# Patient Record
Sex: Female | Born: 1970 | Race: White | Hispanic: No | Marital: Married | Smoking: Never smoker
Health system: Southern US, Community
[De-identification: ages and names within clinical notes are randomized; demographics above are authoritative.]

## PROBLEM LIST (undated history)

## (undated) DIAGNOSIS — M545 Low back pain, unspecified: Secondary | ICD-10-CM

## (undated) DIAGNOSIS — F419 Anxiety disorder, unspecified: Secondary | ICD-10-CM

## (undated) DIAGNOSIS — J302 Other seasonal allergic rhinitis: Secondary | ICD-10-CM

## (undated) DIAGNOSIS — R87619 Unspecified abnormal cytological findings in specimens from cervix uteri: Secondary | ICD-10-CM

## (undated) DIAGNOSIS — G4700 Insomnia, unspecified: Secondary | ICD-10-CM

## (undated) DIAGNOSIS — I10 Essential (primary) hypertension: Secondary | ICD-10-CM

## (undated) DIAGNOSIS — N289 Disorder of kidney and ureter, unspecified: Secondary | ICD-10-CM

## (undated) DIAGNOSIS — G8929 Other chronic pain: Secondary | ICD-10-CM

## (undated) DIAGNOSIS — N809 Endometriosis, unspecified: Secondary | ICD-10-CM

## (undated) HISTORY — DX: Insomnia, unspecified: G47.00

## (undated) HISTORY — PX: DILATION AND CURETTAGE OF UTERUS: SHX78

## (undated) HISTORY — PX: CHOLECYSTECTOMY: SHX55

## (undated) HISTORY — DX: Low back pain, unspecified: M54.50

## (undated) HISTORY — PX: ABDOMINAL HYSTERECTOMY: SHX81

## (undated) HISTORY — PX: DIAGNOSTIC LAPAROSCOPY: SUR761

## (undated) HISTORY — DX: Other chronic pain: G89.29

## (undated) HISTORY — DX: Unspecified abnormal cytological findings in specimens from cervix uteri: R87.619

## (undated) HISTORY — DX: Endometriosis, unspecified: N80.9

---
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
## 2004-11-12 ENCOUNTER — Emergency Department: Payer: Self-pay | Admitting: Unknown Physician Specialty

## 2004-11-22 ENCOUNTER — Ambulatory Visit: Payer: Self-pay | Admitting: Internal Medicine

## 2008-05-09 ENCOUNTER — Ambulatory Visit: Payer: Self-pay | Admitting: General Practice

## 2009-01-06 ENCOUNTER — Ambulatory Visit: Payer: Self-pay | Admitting: General Practice

## 2010-02-08 ENCOUNTER — Ambulatory Visit: Payer: Self-pay | Admitting: Internal Medicine

## 2010-03-18 ENCOUNTER — Ambulatory Visit: Payer: Self-pay | Admitting: General Practice

## 2010-03-19 ENCOUNTER — Ambulatory Visit: Payer: Self-pay | Admitting: General Practice

## 2010-06-09 ENCOUNTER — Ambulatory Visit: Payer: Self-pay | Admitting: General Practice

## 2011-01-26 ENCOUNTER — Ambulatory Visit: Payer: Self-pay | Admitting: General Practice

## 2011-02-15 ENCOUNTER — Ambulatory Visit: Payer: Self-pay | Admitting: General Practice

## 2011-06-01 ENCOUNTER — Ambulatory Visit: Payer: Self-pay | Admitting: Surgery

## 2011-06-02 LAB — PATHOLOGY REPORT

## 2011-10-18 ENCOUNTER — Emergency Department: Payer: Self-pay | Admitting: Emergency Medicine

## 2011-10-24 ENCOUNTER — Ambulatory Visit: Payer: Self-pay | Admitting: General Practice

## 2011-11-01 ENCOUNTER — Ambulatory Visit: Payer: Self-pay | Admitting: General Practice

## 2012-03-15 ENCOUNTER — Ambulatory Visit: Payer: Self-pay | Admitting: Urology

## 2012-03-22 ENCOUNTER — Ambulatory Visit: Payer: Self-pay | Admitting: Urology

## 2013-09-04 ENCOUNTER — Ambulatory Visit: Payer: Self-pay | Admitting: Obstetrics and Gynecology

## 2013-09-04 LAB — CBC
HCT: 40.4 % (ref 35.0–47.0)
MCH: 29.7 pg (ref 26.0–34.0)
RBC: 4.64 10*6/uL (ref 3.80–5.20)
RDW: 13.7 % (ref 11.5–14.5)
WBC: 8 10*3/uL (ref 3.6–11.0)

## 2013-09-04 LAB — BASIC METABOLIC PANEL
Chloride: 104 mmol/L (ref 98–107)
Creatinine: 0.78 mg/dL (ref 0.60–1.30)
Osmolality: 277 (ref 275–301)
Potassium: 4 mmol/L (ref 3.5–5.1)

## 2013-09-17 ENCOUNTER — Ambulatory Visit: Payer: Self-pay | Admitting: General Practice

## 2013-09-18 HISTORY — PX: ABLATION: SHX5711

## 2013-09-20 ENCOUNTER — Ambulatory Visit: Payer: Self-pay | Admitting: Obstetrics and Gynecology

## 2013-09-20 LAB — BASIC METABOLIC PANEL
BUN: 13 mg/dL (ref 7–18)
Calcium, Total: 8.4 mg/dL — ABNORMAL LOW (ref 8.5–10.1)
Calcium, Total: 8.4 mg/dL — ABNORMAL LOW (ref 8.5–10.1)
Chloride: 104 mmol/L (ref 98–107)
Creatinine: 1.21 mg/dL (ref 0.60–1.30)
Creatinine: 1.25 mg/dL (ref 0.60–1.30)
EGFR (African American): >60
EGFR (African American): >60
EGFR (Non-African Amer.): 56 — ABNORMAL LOW
EGFR (Non-African Amer.): 54 — ABNORMAL LOW
Glucose: 156 mg/dL — ABNORMAL HIGH (ref 65–99)
Osmolality: 275 (ref 275–301)

## 2013-09-20 LAB — URINALYSIS, COMPLETE
Bacteria: NONE SEEN
Glucose,UR: NEGATIVE mg/dL (ref 0–75)
Leukocyte Esterase: NEGATIVE
Ph: 5 (ref 4.5–8.0)
Protein: NEGATIVE
Specific Gravity: 1.026 (ref 1.003–1.030)
Squamous Epithelial: NONE SEEN

## 2013-09-20 LAB — CBC WITH DIFFERENTIAL/PLATELET
Basophil %: 0.6 %
Eosinophil #: 0 10*3/uL (ref 0.0–0.7)
Eosinophil %: 0 %
HCT: 37.8 % (ref 35.0–47.0)
HGB: 12.9 g/dL (ref 12.0–16.0)
Lymphocyte #: 1 10*3/uL (ref 1.0–3.6)
Monocyte #: 0.3 x10 3/mm (ref 0.2–0.9)
Neutrophil %: 92.7 %
Platelet: 195 10*3/uL (ref 150–440)
RBC: 4.37 10*6/uL (ref 3.80–5.20)
RDW: 13.5 % (ref 11.5–14.5)
WBC: 18.5 10*3/uL — ABNORMAL HIGH (ref 3.6–11.0)

## 2013-09-21 LAB — CBC WITH DIFFERENTIAL/PLATELET
Basophil #: 0 10*3/uL (ref 0.0–0.1)
Basophil %: 0.1 %
Eosinophil #: 0 10*3/uL (ref 0.0–0.7)
Eosinophil %: 0 %
HGB: 12.2 g/dL (ref 12.0–16.0)
Lymphocyte #: 0.8 10*3/uL — ABNORMAL LOW (ref 1.0–3.6)
Lymphocyte %: 5.6 %
MCH: 29.3 pg (ref 26.0–34.0)
MCHC: 33.6 g/dL (ref 32.0–36.0)
MCV: 87 fL (ref 80–100)
Monocyte #: 0.6 x10 3/mm (ref 0.2–0.9)
Monocyte %: 3.9 %
Neutrophil #: 13 10*3/uL — ABNORMAL HIGH (ref 1.4–6.5)
Platelet: 209 10*3/uL (ref 150–440)

## 2013-09-21 LAB — BASIC METABOLIC PANEL
BUN: 10 mg/dL (ref 7–18)
Calcium, Total: 8 mg/dL — ABNORMAL LOW (ref 8.5–10.1)
Calcium, Total: 7.9 mg/dL — ABNORMAL LOW (ref 8.5–10.1)
Chloride: 103 mmol/L (ref 98–107)
Co2: 25 mmol/L (ref 21–32)
EGFR (African American): >60
EGFR (African American): >60
Glucose: 134 mg/dL — ABNORMAL HIGH (ref 65–99)
Osmolality: 273 (ref 275–301)
Potassium: 4 mmol/L (ref 3.5–5.1)
Potassium: 3.8 mmol/L (ref 3.5–5.1)
Sodium: 136 mmol/L (ref 136–145)
Sodium: 135 mmol/L — ABNORMAL LOW (ref 136–145)

## 2013-09-25 LAB — PATHOLOGY REPORT

## 2014-12-01 ENCOUNTER — Emergency Department: Payer: Self-pay | Admitting: Emergency Medicine

## 2014-12-01 LAB — COMPREHENSIVE METABOLIC PANEL
ALBUMIN: 3.7 g/dL (ref 3.4–5.0)
ALK PHOS: 68 U/L
Anion Gap: 11 (ref 7–16)
BUN: 14 mg/dL (ref 7–18)
Bilirubin,Total: 0.4 mg/dL (ref 0.2–1.0)
CREATININE: 1.04 mg/dL (ref 0.60–1.30)
Calcium, Total: 9 mg/dL (ref 8.5–10.1)
Chloride: 105 mmol/L (ref 98–107)
Co2: 24 mmol/L (ref 21–32)
Glucose: 114 mg/dL — ABNORMAL HIGH (ref 65–99)
Osmolality: 281 (ref 275–301)
Potassium: 3.5 mmol/L (ref 3.5–5.1)
SGOT(AST): 19 U/L (ref 15–37)
SGPT (ALT): 23 U/L
Sodium: 140 mmol/L (ref 136–145)
Total Protein: 7.5 g/dL (ref 6.4–8.2)

## 2014-12-01 LAB — CBC WITH DIFFERENTIAL/PLATELET
Basophil #: 0.1 10*3/uL (ref 0.0–0.1)
Basophil %: 1 %
EOS PCT: 0.5 %
Eosinophil #: 0.1 10*3/uL (ref 0.0–0.7)
HCT: 41.1 % (ref 35.0–47.0)
HGB: 13.5 g/dL (ref 12.0–16.0)
LYMPHS ABS: 2.3 10*3/uL (ref 1.0–3.6)
LYMPHS PCT: 15.9 %
MCH: 29.1 pg (ref 26.0–34.0)
MCHC: 32.9 g/dL (ref 32.0–36.0)
MCV: 88 fL (ref 80–100)
Monocyte #: 0.8 x10 3/mm (ref 0.2–0.9)
Monocyte %: 5.2 %
NEUTROS PCT: 77.4 %
Neutrophil #: 11.3 10*3/uL — ABNORMAL HIGH (ref 1.4–6.5)
PLATELETS: 248 10*3/uL (ref 150–440)
RBC: 4.65 10*6/uL (ref 3.80–5.20)
RDW: 13.4 % (ref 11.5–14.5)
WBC: 14.6 10*3/uL — AB (ref 3.6–11.0)

## 2014-12-01 LAB — URINALYSIS, COMPLETE
Bilirubin,UR: NEGATIVE
Glucose,UR: NEGATIVE mg/dL (ref 0–75)
LEUKOCYTE ESTERASE: NEGATIVE
NITRITE: NEGATIVE
Ph: 5 (ref 4.5–8.0)
Specific Gravity: 1.03 (ref 1.003–1.030)
Squamous Epithelial: 5

## 2014-12-01 LAB — HCG, QUANTITATIVE, PREGNANCY

## 2015-01-22 ENCOUNTER — Emergency Department: Payer: Self-pay | Admitting: Emergency Medicine

## 2015-01-22 LAB — CBC WITH DIFFERENTIAL/PLATELET
BASOS PCT: 0.5 %
Basophil #: 0.1 10*3/uL (ref 0.0–0.1)
EOS ABS: 0.1 10*3/uL (ref 0.0–0.7)
EOS PCT: 1.2 %
HCT: 41.4 % (ref 35.0–47.0)
HGB: 13.8 g/dL (ref 12.0–16.0)
Lymphocyte #: 2.8 10*3/uL (ref 1.0–3.6)
Lymphocyte %: 23.9 %
MCH: 29.2 pg (ref 26.0–34.0)
MCHC: 33.2 g/dL (ref 32.0–36.0)
MCV: 88 fL (ref 80–100)
MONO ABS: 0.7 x10 3/mm (ref 0.2–0.9)
Monocyte %: 5.8 %
NEUTROS ABS: 7.9 10*3/uL — AB (ref 1.4–6.5)
Neutrophil %: 68.6 %
Platelet: 263 10*3/uL (ref 150–440)
RBC: 4.72 10*6/uL (ref 3.80–5.20)
RDW: 13.5 % (ref 11.5–14.5)
WBC: 11.6 10*3/uL — ABNORMAL HIGH (ref 3.6–11.0)

## 2015-01-22 LAB — COMPREHENSIVE METABOLIC PANEL
ALBUMIN: 3.4 g/dL (ref 3.4–5.0)
ALK PHOS: 63 U/L (ref 46–116)
ANION GAP: 9 (ref 7–16)
BUN: 16 mg/dL (ref 7–18)
Bilirubin,Total: 0.4 mg/dL (ref 0.2–1.0)
Calcium, Total: 8.8 mg/dL (ref 8.5–10.1)
Chloride: 106 mmol/L (ref 98–107)
Co2: 25 mmol/L (ref 21–32)
Creatinine: 1.09 mg/dL (ref 0.60–1.30)
EGFR (African American): >60
EGFR (Non-African Amer.): 59 — ABNORMAL LOW
GLUCOSE: 108 mg/dL — AB (ref 65–99)
Osmolality: 281 (ref 275–301)
Potassium: 3.6 mmol/L (ref 3.5–5.1)
SGOT(AST): 22 U/L (ref 15–37)
SGPT (ALT): 23 U/L (ref 14–63)
Sodium: 140 mmol/L (ref 136–145)
Total Protein: 7.7 g/dL (ref 6.4–8.2)

## 2015-01-22 LAB — LIPASE, BLOOD: Lipase: 70 U/L — ABNORMAL LOW (ref 73–393)

## 2015-01-22 LAB — MAGNESIUM: MAGNESIUM: 2 mg/dL

## 2015-01-22 LAB — HCG, QUANTITATIVE, PREGNANCY: Beta Hcg, Quant.: <1 m[IU]/mL — ABNORMAL LOW

## 2015-02-10 DIAGNOSIS — G8929 Other chronic pain: Secondary | ICD-10-CM | POA: Diagnosis present

## 2015-02-10 DIAGNOSIS — D759 Disease of blood and blood-forming organs, unspecified: Secondary | ICD-10-CM | POA: Insufficient documentation

## 2015-02-10 DIAGNOSIS — R102 Pelvic and perineal pain: Secondary | ICD-10-CM | POA: Diagnosis present

## 2015-02-10 DIAGNOSIS — Z9889 Other specified postprocedural states: Secondary | ICD-10-CM | POA: Diagnosis present

## 2015-04-10 NOTE — Op Note (Signed)
PATIENT NAME:  Felicia Barker, Felicia Barker MR#:  [id number] DATE OF BIRTH:  27-Mar-1971  DATE OF PROCEDURE:  09/20/2013  PREOPERATIVE DIAGNOSES:  Left ovarian cyst and menorrhagia.   POSTOPERATIVE DIAGNOSES:  Left endometrioma, stage II endometriosis.   OPERATION PERFORMED:  Hysteroscopy, dilatation and curettage, NovaSure endometrial ablation, and left oophorectomy.   ANESTHESIA:  General.  PRIMARY SERVICE:  Malachy Mood, MD  PREOPERATIVE ANTIBIOTICS:  None.   DRAINS OR TUBES:  None.  IMPLANTS:  None.   ESTIMATED BLOOD LOSS:  25 mL.  OPERATIVE FLUIDS: 1300 mL  crystalloid.  URINE OUTPUT:  150 mL of clear urine.   COMPLICATIONS:  None.   INTRAOPERATIVE FINDINGS: Left endometrioma adhesed to the left pelvic sidewall, the cul-de-sac with some filmy adhesions, also some involvement of the right tube.  The overall stage of endometriosis is stage II. The cervix was noted to be somewhat stenotic. The uterus had minimal descensus. The cavity appeared normal in contour, with a moderate amount of tissue removed on D and C. The cavity sounding length was 10 cm, the cervical length was 5.5 cm, for a cavity length of 4.5 cm. Cavity width was found to be 3.0 cm.   SPECIMENS REMOVED:  Endometrial curetting.   PATIENT CONDITION FOLLOWING THE PROCEDURE:  Stable.   PROCEDURE IN DETAIL: Risks, benefits and alternatives of the procedure were discussed with the patient prior to proceeding to the operating room. The patient was taken to the operating room, where she was placed under general endotracheal anesthesia. The patient was positioned in the dorsal lithotomy position using Allen stirrups, prepped and draped in the usual sterile fashion before performing a timeout procedure. Attention was turned to the patient's pelvis. A red rubber catheter was used to empty the patient's bladder. An operative speculum was then placed. The anterior lip of the cervix was visualized, grasped with a single-tooth tenaculum, and a  Hulka tenaculum was placed. The single-tooth tenaculum and speculum were then removed.   Attention was turned to the patient's abdomen. The umbilicus was infiltrated with 1% lidocaine. A stab incision was made at the base of the umbilicus, and a 5 mm Xcel trocar was used to gain entry into the abdominal cavity under direct visualization. Upon entering the peritoneal cavity, insufflation was begun. A 12 mm left lateral assistance port was placed under direct visualization, followed by a 5 mm right lateral assistance port.  Inspection of the pelvis revealed the above findings. The uterus was elevated. The ovary was grasped and dissected off of the adhesions to the posterior uterus and left pelvic sidewall using blunt dissection. The IP ligament was then identified, ligated using the 5 mm LigaSure hugging the ovary. The ovary ovary was then transected off the mesosalpinx and the utero-ovarian ligament using the 55mm harmonic. There was some bleeding from the IP ligament, which was cauterized using a short application bipolar diathermy. Following this, the pedicle was inspected and noted to be hemostatic. The pelvis was irrigated. The 12 mm port site was used to extract the ovary using a reusable Endo Catch bag. Following removal of the specimen, the 12 mm port site was closed using a laparoscopic closure device. There was no defect noted in the fascia after tying down the 0 Vicryl for the fascia. The skin of the 12 mm port site was closed using a 4-0 Monocryl in a subcuticular fashion. The remaining port sites were then dressed with Dermabond.   Attention was then turned to the patient's pelvis. The Hulka tenaculum was  removed. The operative speculum was replaced, as was the single-tooth tenaculum. The cervix was sequentially dilated to allow hysteroscopy. Hysteroscopy revealed some scarring of the cervix and difficult entry into the uterine cavity. However, once the internal os was sufficiently dilated to allow  passage of the hysteroscope, there were no defects or abnormal contours noted to the cavity. There was, however, a moderate amount of fluffy endometrium noted. Sharp curettage was performed. Following the sharp curettage, the uterus length was re-measured at 10 cm. The cervical length was measured using the hysteroscope at 5.5 cm, yielding a cavity length of 4.5 cm. The NovaSure device was then placed. The cavity width was 3 cm. The device was activated and passed cavity assessment. The device was then activated a second time for the actual ablation. The ablation time was 2 minutes, at which time the device shut off. Following the ablation, the NovaSure device was removed, and a followup hysteroscopy revealed good coverage of the endometrial cavity by the NovaSure ablation, with sparing of the endocervical canal. The single-tooth tenaculum was removed. The tenaculum sites were hemostatic. Sponge, needle and instrument counts were correct x2. The patient tolerated the procedure well, and was taken to the recovery room in stable condition.     ____________________________ Stoney Bang. Georgianne Fick, MD ams:mr D: 09/20/2013 17:43:00 ET T: 09/20/2013 21:36:46 ET JOB#: [id number]  cc: Stoney Bang. Georgianne Fick, MD, <Dictator> Dorthula Nettles MD ELECTRONICALLY SIGNED 09/21/2013 14:07

## 2015-05-04 ENCOUNTER — Other Ambulatory Visit: Payer: Self-pay

## 2015-05-07 ENCOUNTER — Encounter
Admission: RE | Admit: 2015-05-07 | Discharge: 2015-05-07 | Disposition: A | Discharge status: Home / Self Care | Payer: BLUE CROSS/BLUE SHIELD | Source: Ambulatory Visit | Attending: Obstetrics and Gynecology | Admitting: Obstetrics and Gynecology

## 2015-05-07 DIAGNOSIS — Z01812 Encounter for preprocedural laboratory examination: Secondary | ICD-10-CM | POA: Diagnosis present

## 2015-05-07 DIAGNOSIS — R102 Pelvic and perineal pain: Secondary | ICD-10-CM | POA: Diagnosis not present

## 2015-05-07 DIAGNOSIS — I10 Essential (primary) hypertension: Secondary | ICD-10-CM | POA: Diagnosis not present

## 2015-05-07 DIAGNOSIS — N946 Dysmenorrhea, unspecified: Secondary | ICD-10-CM | POA: Insufficient documentation

## 2015-05-07 DIAGNOSIS — Z01810 Encounter for preprocedural cardiovascular examination: Secondary | ICD-10-CM | POA: Diagnosis not present

## 2015-05-07 HISTORY — DX: Anxiety disorder, unspecified: F41.9

## 2015-05-07 HISTORY — DX: Essential (primary) hypertension: I10

## 2015-05-07 HISTORY — DX: Disorder of kidney and ureter, unspecified: N28.9

## 2015-05-07 HISTORY — DX: Other seasonal allergic rhinitis: J30.2

## 2015-05-07 LAB — CBC
HCT: 40.7 % (ref 35.0–47.0)
HEMOGLOBIN: 13.6 g/dL (ref 12.0–16.0)
MCH: 29.3 pg (ref 26.0–34.0)
MCHC: 33.6 g/dL (ref 32.0–36.0)
MCV: 87.3 fL (ref 80.0–100.0)
PLATELETS: 241 10*3/uL (ref 150–440)
RBC: 4.66 MIL/uL (ref 3.80–5.20)
RDW: 13.4 % (ref 11.5–14.5)
WBC: 9.4 10*3/uL (ref 3.6–11.0)

## 2015-05-07 LAB — BASIC METABOLIC PANEL
ANION GAP: 8 (ref 5–15)
BUN: 17 mg/dL (ref 6–20)
CHLORIDE: 106 mmol/L (ref 101–111)
CO2: 26 mmol/L (ref 22–32)
Calcium: 8.9 mg/dL (ref 8.9–10.3)
Creatinine, Ser: 0.87 mg/dL (ref 0.44–1.00)
GFR calc non Af Amer: >60 mL/min (ref >60)
Glucose, Bld: 91 mg/dL (ref 65–99)
Potassium: 3.8 mmol/L (ref 3.5–5.1)
SODIUM: 140 mmol/L (ref 135–145)

## 2015-05-07 LAB — TYPE AND SCREEN
ABO/RH(D): O POS
Antibody Screen: NEGATIVE

## 2015-05-07 LAB — ABO/RH: ABO/RH(D): O POS

## 2015-05-07 NOTE — Patient Instructions (Addendum)
  Your procedure is scheduled on: 05/12/2015  Report to Day Surgery  To find out your arrival time please call [phone] between 1PM - 3PM on 05/11/2015  Remember: Instructions that are not followed completely may result in serious medical risk, up to and including death, or upon the discretion of your surgeon and anesthesiologist your surgery may need to be rescheduled.    ___x_ 1. Do not eat food or drink liquids after midnight. No gum chewing or hard candies.     ___x_ 2. No Alcohol for 24 hours before or after surgery.   ___x_ 3. Bring all medications with you on the day of surgery if instructed. B   ___x_ 4. Notify your doctor if there is any change in your medical condition     (cold, fever, infections).     Do not wear jewelry, make-up, hairpins, clips or nail polish.  Do not wear lotions, powders, or perfumes. You may wear deodorant.  Do not shave 48 hours prior to surgery. Men may shave face and neck.  Do not bring valuables to the hospital.    [facility] is not responsible for any belongings or valuables.               Contacts, dentures or bridgework may not be worn into surgery.  Leave your suitcase in the car. After surgery it may be brought to your room.  For patients admitted to the hospital, discharge time is determined by your                treatment team.   Patients discharged the day of surgery will not be allowed to drive home.   Please read over the following fact sheets that you were given:   Surgical Site Infection Prevention   ____ Take these medicines the morning of surgery with A SIP OF WATER:    1. May take Zyrtec   2. Bring bisoprolol/hctz  3.   4.  5.  6.  ____ Fleet Enema (as directed)   __x__ Use CHG Soap as directed  ____ Use inhalers on the day of surgery  ____ Stop metformin 2 days prior to surgery    ____ Take 1/2 of usual insulin dose the night before surgery and none on the morning of surgery.   ____ Stop  Coumadin/Plavix/aspirin on    __x__ Stop Anti-inflammatories on  Today 5/19   ____ Stop supplements until after surgery.    ____ Bring C-Pap to the hospital.

## 2015-05-12 ENCOUNTER — Observation Stay
Admission: AD | Admit: 2015-05-12 | Discharge: 2015-05-13 | Disposition: A | Discharge status: Home / Self Care | Payer: BLUE CROSS/BLUE SHIELD | Source: Ambulatory Visit | Attending: Obstetrics and Gynecology | Admitting: Obstetrics and Gynecology

## 2015-05-12 ENCOUNTER — Ambulatory Visit: Payer: BLUE CROSS/BLUE SHIELD | Admitting: Anesthesiology

## 2015-05-12 ENCOUNTER — Encounter
Admission: AD | Disposition: A | Discharge status: Home / Self Care | Payer: Self-pay | Source: Ambulatory Visit | Attending: Obstetrics and Gynecology

## 2015-05-12 DIAGNOSIS — N8320 Unspecified ovarian cysts: Secondary | ICD-10-CM | POA: Insufficient documentation

## 2015-05-12 DIAGNOSIS — Z79899 Other long term (current) drug therapy: Secondary | ICD-10-CM | POA: Insufficient documentation

## 2015-05-12 DIAGNOSIS — I10 Essential (primary) hypertension: Secondary | ICD-10-CM | POA: Diagnosis not present

## 2015-05-12 DIAGNOSIS — Z881 Allergy status to other antibiotic agents status: Secondary | ICD-10-CM | POA: Insufficient documentation

## 2015-05-12 DIAGNOSIS — N946 Dysmenorrhea, unspecified: Secondary | ICD-10-CM | POA: Insufficient documentation

## 2015-05-12 DIAGNOSIS — Z9049 Acquired absence of other specified parts of digestive tract: Secondary | ICD-10-CM | POA: Insufficient documentation

## 2015-05-12 DIAGNOSIS — G8929 Other chronic pain: Secondary | ICD-10-CM | POA: Diagnosis not present

## 2015-05-12 DIAGNOSIS — N736 Female pelvic peritoneal adhesions (postinfective): Secondary | ICD-10-CM | POA: Insufficient documentation

## 2015-05-12 DIAGNOSIS — Z833 Family history of diabetes mellitus: Secondary | ICD-10-CM | POA: Diagnosis not present

## 2015-05-12 DIAGNOSIS — Z8249 Family history of ischemic heart disease and other diseases of the circulatory system: Secondary | ICD-10-CM | POA: Insufficient documentation

## 2015-05-12 DIAGNOSIS — Z803 Family history of malignant neoplasm of breast: Secondary | ICD-10-CM | POA: Diagnosis not present

## 2015-05-12 DIAGNOSIS — R102 Pelvic and perineal pain unspecified side: Secondary | ICD-10-CM | POA: Diagnosis present

## 2015-05-12 DIAGNOSIS — N800 Endometriosis of uterus: Principal | ICD-10-CM | POA: Insufficient documentation

## 2015-05-12 DIAGNOSIS — D251 Intramural leiomyoma of uterus: Secondary | ICD-10-CM | POA: Insufficient documentation

## 2015-05-12 DIAGNOSIS — Z90721 Acquired absence of ovaries, unilateral: Secondary | ICD-10-CM

## 2015-05-12 DIAGNOSIS — N802 Endometriosis of fallopian tube: Secondary | ICD-10-CM | POA: Diagnosis not present

## 2015-05-12 DIAGNOSIS — Z90710 Acquired absence of both cervix and uterus: Secondary | ICD-10-CM

## 2015-05-12 DIAGNOSIS — Z882 Allergy status to sulfonamides status: Secondary | ICD-10-CM | POA: Diagnosis not present

## 2015-05-12 DIAGNOSIS — Z9889 Other specified postprocedural states: Secondary | ICD-10-CM | POA: Diagnosis present

## 2015-05-12 HISTORY — PX: LAPAROSCOPIC SALPINGO OOPHERECTOMY: SHX5927

## 2015-05-12 HISTORY — PX: LAPAROSCOPIC HYSTERECTOMY: SHX1926

## 2015-05-12 HISTORY — PX: CYSTOSCOPY: SHX5120

## 2015-05-12 LAB — POCT PREGNANCY, URINE: PREG TEST UR: NEGATIVE

## 2015-05-12 SURGERY — HYSTERECTOMY, TOTAL, LAPAROSCOPIC
Anesthesia: General | Laterality: Right

## 2015-05-12 MED ORDER — DIPHENHYDRAMINE HCL 50 MG/ML IJ SOLN: 12.5000 mg | Freq: Four times a day (QID) | INTRAMUSCULAR | Status: DC | PRN

## 2015-05-12 MED ORDER — FENTANYL CITRATE (PF) 100 MCG/2ML IJ SOLN
25.0000 ug | INTRAMUSCULAR | Status: AC | PRN
  Administered 2015-05-12 (×6): 25 ug via INTRAVENOUS

## 2015-05-12 MED ORDER — SUCCINYLCHOLINE CHLORIDE 20 MG/ML IJ SOLN
INTRAMUSCULAR | Status: DC | PRN
  Administered 2015-05-12: 100 mg via INTRAVENOUS

## 2015-05-12 MED ORDER — ONDANSETRON HCL 4 MG PO TABS: 4.0000 mg | ORAL_TABLET | Freq: Four times a day (QID) | ORAL | Status: DC | PRN

## 2015-05-12 MED ORDER — LACTATED RINGERS IV SOLN
INTRAVENOUS | Status: DC
  Administered 2015-05-12 (×2): via INTRAVENOUS

## 2015-05-12 MED ORDER — FAMOTIDINE 20 MG PO TABS
ORAL_TABLET | ORAL | Status: AC
  Filled 2015-05-12: qty 1

## 2015-05-12 MED ORDER — BUPIVACAINE HCL 0.5 % IJ SOLN
INTRAMUSCULAR | Status: DC | PRN
  Administered 2015-05-12: 10 mL
  Administered 2015-05-12: 7 mL

## 2015-05-12 MED ORDER — GLYCOPYRROLATE 0.2 MG/ML IJ SOLN
INTRAMUSCULAR | Status: DC | PRN
  Administered 2015-05-12: 0.6 mg via INTRAVENOUS

## 2015-05-12 MED ORDER — NALOXONE HCL 0.4 MG/ML IJ SOLN: 0.4000 mg | INTRAMUSCULAR | Status: DC | PRN

## 2015-05-12 MED ORDER — FAMOTIDINE 20 MG PO TABS
20.0000 mg | ORAL_TABLET | Freq: Once | ORAL | Status: AC
  Administered 2015-05-12: 20 mg via ORAL

## 2015-05-12 MED ORDER — MORPHINE SULFATE (PF) 1 MG/ML IV SOLN
INTRAVENOUS | Status: DC
  Administered 2015-05-12 (×2): 1 mg via INTRAVENOUS
  Administered 2015-05-12: 9 mL via INTRAVENOUS
  Administered 2015-05-13: 1 mL via INTRAVENOUS
  Filled 2015-05-12 (×3): qty 25

## 2015-05-12 MED ORDER — DIPHENHYDRAMINE HCL 12.5 MG/5ML PO ELIX
12.5000 mg | ORAL_SOLUTION | Freq: Four times a day (QID) | ORAL | Status: DC | PRN
  Filled 2015-05-12: qty 5

## 2015-05-12 MED ORDER — FENTANYL CITRATE (PF) 100 MCG/2ML IJ SOLN
INTRAMUSCULAR | Status: DC | PRN
  Administered 2015-05-12: 50 ug via INTRAVENOUS
  Administered 2015-05-12 (×2): 100 ug via INTRAVENOUS

## 2015-05-12 MED ORDER — ONDANSETRON HCL 4 MG/2ML IJ SOLN: 4.0000 mg | Freq: Once | INTRAMUSCULAR | Status: DC | PRN

## 2015-05-12 MED ORDER — DEXTROSE 5 % IV SOLN
1.0000 g | Freq: Once | INTRAVENOUS | Status: AC
  Administered 2015-05-12: 1 g via INTRAVENOUS
  Filled 2015-05-12: qty 1

## 2015-05-12 MED ORDER — FENTANYL CITRATE (PF) 100 MCG/2ML IJ SOLN
INTRAMUSCULAR | Status: AC
  Filled 2015-05-12: qty 2

## 2015-05-12 MED ORDER — PROPOFOL 10 MG/ML IV BOLUS
INTRAVENOUS | Status: DC | PRN
  Administered 2015-05-12: 150 mg via INTRAVENOUS

## 2015-05-12 MED ORDER — DEXMEDETOMIDINE HCL IN NACL 200 MCG/50ML IV SOLN
INTRAVENOUS | Status: DC | PRN
Start: 2015-05-12 — End: 2015-05-12
  Administered 2015-05-12: 12 ug via INTRAVENOUS

## 2015-05-12 MED ORDER — ROCURONIUM BROMIDE 100 MG/10ML IV SOLN
INTRAVENOUS | Status: DC | PRN
Start: 2015-05-12 — End: 2015-05-12
  Administered 2015-05-12: 25 mg via INTRAVENOUS
  Administered 2015-05-12: 5 mg via INTRAVENOUS
  Administered 2015-05-12: 20 mg via INTRAVENOUS

## 2015-05-12 MED ORDER — LIDOCAINE HCL (CARDIAC) 20 MG/ML IV SOLN
INTRAVENOUS | Status: DC | PRN
Start: 2015-05-12 — End: 2015-05-12
  Administered 2015-05-12: 100 mg via INTRAVENOUS

## 2015-05-12 MED ORDER — LABETALOL HCL 5 MG/ML IV SOLN
INTRAVENOUS | Status: DC | PRN
  Administered 2015-05-12: 10 mg via INTRAVENOUS

## 2015-05-12 MED ORDER — SIMETHICONE 80 MG PO CHEW
80.0000 mg | CHEWABLE_TABLET | Freq: Four times a day (QID) | ORAL | Status: DC | PRN
  Administered 2015-05-13 (×2): 80 mg via ORAL
  Filled 2015-05-12 (×2): qty 1

## 2015-05-12 MED ORDER — BISOPROLOL FUMARATE 5 MG PO TABS
5.0000 mg | ORAL_TABLET | Freq: Once | ORAL | Status: AC
  Administered 2015-05-12: 5 mg via ORAL
  Filled 2015-05-12: qty 1

## 2015-05-12 MED ORDER — BUPIVACAINE HCL (PF) 0.5 % IJ SOLN
INTRAMUSCULAR | Status: AC
  Filled 2015-05-12: qty 30

## 2015-05-12 MED ORDER — DEXTROSE-NACL 5-0.45 % IV SOLN
INTRAVENOUS | Status: DC
  Administered 2015-05-12: 125 mL/h via INTRAVENOUS
  Administered 2015-05-13: 1 mL via INTRAVENOUS
  Administered 2015-05-13: 12:00:00 via INTRAVENOUS

## 2015-05-12 MED ORDER — ONDANSETRON HCL 4 MG/2ML IJ SOLN
4.0000 mg | Freq: Four times a day (QID) | INTRAMUSCULAR | Status: DC | PRN
Start: 2015-05-12 — End: 2015-05-13
  Administered 2015-05-12: 4 mg via INTRAVENOUS
  Filled 2015-05-12: qty 2

## 2015-05-12 MED ORDER — KETOROLAC TROMETHAMINE 30 MG/ML IJ SOLN
30.0000 mg | Freq: Four times a day (QID) | INTRAMUSCULAR | Status: DC
  Administered 2015-05-12 – 2015-05-13 (×2): 30 mg via INTRAVENOUS
  Filled 2015-05-12 (×2): qty 1

## 2015-05-12 MED ORDER — KETOROLAC TROMETHAMINE 30 MG/ML IJ SOLN: 30.0000 mg | Freq: Four times a day (QID) | INTRAMUSCULAR | Status: DC

## 2015-05-12 MED ORDER — MIDAZOLAM HCL 2 MG/2ML IJ SOLN
INTRAMUSCULAR | Status: DC | PRN
  Administered 2015-05-12: 2 mg via INTRAVENOUS

## 2015-05-12 MED ORDER — ONDANSETRON HCL 4 MG/2ML IJ SOLN
INTRAMUSCULAR | Status: DC | PRN
  Administered 2015-05-12: 4 mg via INTRAVENOUS

## 2015-05-12 MED ORDER — LIDOCAINE HCL (PF) 1 % IJ SOLN
INTRAMUSCULAR | Status: AC
  Administered 2015-05-12: 09:00:00 via SUBCUTANEOUS
  Filled 2015-05-12: qty 2

## 2015-05-12 MED ORDER — DEXAMETHASONE SODIUM PHOSPHATE 4 MG/ML IJ SOLN
INTRAMUSCULAR | Status: DC | PRN
  Administered 2015-05-12: 10 mg via INTRAVENOUS

## 2015-05-12 MED ORDER — NEOSTIGMINE METHYLSULFATE 10 MG/10ML IV SOLN
INTRAVENOUS | Status: DC | PRN
Start: 2015-05-12 — End: 2015-05-12
  Administered 2015-05-12: 4 mg via INTRAVENOUS

## 2015-05-12 MED ORDER — SODIUM CHLORIDE 0.9 % IJ SOLN: 9.0000 mL | INTRAMUSCULAR | Status: DC | PRN

## 2015-05-12 SURGICAL SUPPLY — 55 items
APPLICATOR ARISTA FLEXITIP XL (MISCELLANEOUS) ×4 IMPLANT
ARISTA ABSORBABLE HEMOSTATIC PARTICLES ×4 IMPLANT
BAG URO DRAIN 2000ML W/SPOUT (MISCELLANEOUS) ×4 IMPLANT
BLADE SURG SZ11 CARB STEEL (BLADE) ×4 IMPLANT
CANISTER SUCT 1200ML W/VALVE (MISCELLANEOUS) ×4 IMPLANT
CATH FOLEY 2WAY  5CC 16FR (CATHETERS) ×2
CATH ROBINSON RED A/P 16FR (CATHETERS) IMPLANT
CATH URTH 16FR FL 2W BLN LF (CATHETERS) ×2 IMPLANT
CHLORAPREP W/TINT 26ML (MISCELLANEOUS) ×4 IMPLANT
DRAPE UNDER BUTTOCK W/FLU (DRAPES) ×4 IMPLANT
FILTER LAP SMOKE EVAC STRL (MISCELLANEOUS) ×4 IMPLANT
GLOVE BIO SURGEON STRL SZ7 (GLOVE) ×16 IMPLANT
GLOVE INDICATOR 7.5 STRL GRN (GLOVE) ×16 IMPLANT
GOWN STRL REUS W/ TWL LRG LVL3 (GOWN DISPOSABLE) ×4 IMPLANT
GOWN STRL REUS W/ TWL XL LVL3 (GOWN DISPOSABLE) ×2 IMPLANT
GOWN STRL REUS W/TWL LRG LVL3 (GOWN DISPOSABLE) ×4
GOWN STRL REUS W/TWL XL LVL3 (GOWN DISPOSABLE) ×2
GRASPER SUT TROCAR 14GX15 (MISCELLANEOUS) IMPLANT
HEMOSTAT ARISTA ABSORB 3G PWDR (MISCELLANEOUS) ×4 IMPLANT
IRRIGATION STRYKERFLOW (MISCELLANEOUS) ×2 IMPLANT
IRRIGATOR STRYKERFLOW (MISCELLANEOUS) ×4
IV LACTATED RINGERS 1000ML (IV SOLUTION) ×4 IMPLANT
JELLY LUB 2OZ STRL (MISCELLANEOUS) ×2
JELLY LUBE 2OZ STRL (MISCELLANEOUS) ×2 IMPLANT
KIT RM TURNOVER CYSTO AR (KITS) ×4 IMPLANT
LABEL OR SOLS (LABEL) ×4 IMPLANT
LIQUID BAND (GAUZE/BANDAGES/DRESSINGS) ×4 IMPLANT
MANIPULATOR VCARE LG CRV RETR (MISCELLANEOUS) ×4 IMPLANT
MANIPULATOR VCARE STD CRV RETR (MISCELLANEOUS) IMPLANT
NS IRRIG 500ML POUR BTL (IV SOLUTION) ×4 IMPLANT
OCCLUDER COLPOPNEUMO (BALLOONS) ×4 IMPLANT
PACK CYSTO AR (MISCELLANEOUS) ×4 IMPLANT
PACK GYN LAPAROSCOPIC (MISCELLANEOUS) ×4 IMPLANT
PAD OB MATERNITY 4.3X12.25 (PERSONAL CARE ITEMS) ×4 IMPLANT
PAD PREP 24X41 OB/GYN DISP (PERSONAL CARE ITEMS) ×4 IMPLANT
PAD TRENDELENBURG OR TABLE (MISCELLANEOUS) ×4 IMPLANT
SCISSORS METZENBAUM CVD 33 (INSTRUMENTS) IMPLANT
SET CYSTO W/LG BORE CLAMP LF (SET/KITS/TRAYS/PACK) ×4 IMPLANT
SHEARS HARMONIC ACE PLUS 36CM (ENDOMECHANICALS) ×4 IMPLANT
SLEEVE ENDOPATH XCEL 5M (ENDOMECHANICALS) ×8 IMPLANT
SPONGE LAP 18X18 5 PK (GAUZE/BANDAGES/DRESSINGS) ×4 IMPLANT
SPONGE XRAY 4X4 16PLY STRL (MISCELLANEOUS) ×8 IMPLANT
STRAP SAFETY BODY (MISCELLANEOUS) ×4 IMPLANT
SUT MNCRL AB 4-0 PS2 18 (SUTURE) IMPLANT
SUT VIC AB 0 CT1 27 (SUTURE) ×2
SUT VIC AB 0 CT1 27XCR 8 STRN (SUTURE) ×2 IMPLANT
SUT VIC AB 2-0 UR6 27 (SUTURE) ×4 IMPLANT
SUT VIC AB 4-0 FS2 27 (SUTURE) IMPLANT
SYR 50ML LL SCALE MARK (SYRINGE) ×4 IMPLANT
SYRINGE 10CC LL (SYRINGE) ×4 IMPLANT
TROCAR ENDO BLADELESS 11MM (ENDOMECHANICALS) ×4 IMPLANT
TROCAR XCEL NON-BLD 5MMX100MML (ENDOMECHANICALS) ×4 IMPLANT
TUBING INSUFFLATOR HEATED (MISCELLANEOUS) ×4 IMPLANT
TUBING INSUFFLATOR HI FLOW (MISCELLANEOUS) ×4 IMPLANT
WATER STERILE IRR 3000ML UROMA (IV SOLUTION) ×4 IMPLANT

## 2015-05-12 NOTE — Transfer of Care (Signed)
Immediate Anesthesia Transfer of Care Note  Patient: Felicia Barker  Procedure(s) Performed: Procedure(s): HYSTERECTOMY TOTAL LAPAROSCOPIC (N/A) LAPAROSCOPIC SALPINGO OOPHORECTOMY (Right) CYSTOSCOPY (N/A)  Patient Location: PACU  Anesthesia Type:General  Level of Consciousness: awake and alert   Airway & Oxygen Therapy: Patient connected to face mask oxygen  Post-op Assessment: Post -op Vital signs reviewed and stable  Post vital signs: stable  Last Vitals:  Filed Vitals:   05/12/15 1143  BP: 115/49  Pulse: 56  Temp: 37.4 C  Resp: 22    Complications: No apparent anesthesia complications

## 2015-05-12 NOTE — Op Note (Signed)
Preoperative Diagnosis: 1) 44 y.o. with chronic pelvic pain  Postoperative Diagnosis: 1) 44 y.o. with chronic pelvic pain  Operation Performed: Laparoscopic total hysterectomy, bilateral salpingectomy, right oophorectomy, and cystoscopy  Indication: Cyclical right lower quadrant pelvic pain, failed conservative medical managment  Anesthesia: .General  Primary Surgeon: Malachy Mood, MD  Assistant: Barnett Applebaum, MD  Preoperative Antibiotics: 1 gram aztreonam   Estimated Blood Loss: 374mL  IV Fluids: 839mL  Drains or Tubes: Foley to gravity drainage, ON-Q catheter system  Implants: none  Specimens Removed: none  Complications: none  Intraoperative Findings:  Globular uterus, right lateral submucosal fibroid, scarring at site of prior left oophorectomy, hematosalpinx of left tube, right simple ovarian cyst.  Cystoscopy revealed intact bladder bilateral efflux of urine from both ureteral orifices   Patient Condition: stable  Procedure in Detail:  Patient was taken to the operating room were she was administered general endotracheal anesthesia.  She was positioned in the dorsal lithotomy position utilizing Allen stirrups, prepped and draped in the usual sterile fashion.  Prior to proceeding with the case a time out was performed.  Attention was turned to the patient's pelvis.  A red indwelling foley catheter was used to decompress the patient's bladder.  An operative speculum was placed to allow visualization of the cervix.  The anterior lip of the cervix was grasped with a single tooth tenaculum, and a large V-care device was placed to allow manipulation of the uterus.  Prior to placing V-care device the uterus was sounded but secondary to scarring likely from her prior ablation was only sounded to 5cm.    Attention was turned to the patient's abdomen.  The umbilicus was injected with 1% plain Sensorcaine.  Utilizing an 11 blade scalpel a a stab incision was made at the base of the  umbilicus.  A 32mm Excel trocar was then used to gain direct entry into the peritoneal cavity utilizing the camera to visualize progress of the trocar during placement.  Once peritoneal entry had been achieved, insufflation was started and pneumoperitoneum established at a pressure of 44mmHg.   General inspection of the abdomen revealed the above noted findings. Two 5 mm lateral lower assistant trocars were placed under direct visualization.  Attention was turned to the patient right adnexa.  The left fallopian tube was grasped in its fimbriated end, transected from its attachments to the mesosalpinx using a 41mm harmonic scalpel.  The right round ligament was transected, the anterior leaf of the broad ligament was taken down the the level of the internal cervical os and a bladder flap was created.  The posterior leaf of the broad was taken down to the level of the left utero sacral ligament.  Some dense peritoneal adhesion/fibrosis was encountered at the site of prior oophorectomy.  Once this was sufficiently mobilized off the V-care cup attention the right uterine artery was cauterized and transected before moving to the patient's right adnexal structures.  The right infundibulopelvic ligament was identified and transected using the 35mm harmonic.  The ovary and fallopian tube were than transected their attachments to the mesosalpinx.  The round ligament was transected, anterior leaf of broad taken down to the level of the internal cervical os meeting the previously started bladder flap.  The posterior leaf of the broad was then taken down to the level of the right utero sacral ligament. The uterine artery was transected on the right as had been done on the left. Given that the operative field was partially obscured because of the  right lateral fibroid the colpotomy was started on the anterior portion of the cervix and carried around circumferentially in a counter clockwise fashion staying within the rim of the  V-care cup and applying cephalad pressure on the V-care handel.  Once the specimen was amputated it was removed vaginally using a ring forceps.  The pelvis was irrigated, the pedicles were noted to be hemostatic.  The cuff was closed vaginally.  A weighted speculum and sims retractor were used to visualized the cuff,  The lateral edges of the cuff were tagged with long alice clamps, and the cuff was closed using interrupted figure of eight sutures of 0 Vicryl.  The cuff was noted to be hemostatic from the vaginal side.  Foley catheter was removed, cystoscopy revealed intact bladder dome, and bilateral efflux of urine from both ureteral orifices.  The foley catheter was replaced, and pneumoperitoneum was reestablished, and the pelvis was irrigated.  The pedicles were re inspected noted to be hemostatic from the peritoneal side. 1 gram of arista powder was applied to the vaginal cuff.  Pneumoperitoneum was evacuated.  The trocars were removed.  All trocar sites were then dressed with surgical skin glue.  Sponge needle and instrument counts were correct time two.  The patient tolerated the procedure well and was taken to the recovery room in stable condition.

## 2015-05-12 NOTE — Brief Op Note (Signed)
05/12/2015  11:42 AM  PATIENT:  Felicia Barker  44 y.o. female  PRE-OPERATIVE DIAGNOSIS:  CHRONIC PELVIC PAIN & DYSMENORRHEA  POST-OPERATIVE DIAGNOSIS:  same as preop  PROCEDURE:  Procedure(s): HYSTERECTOMY TOTAL LAPAROSCOPIC (N/A) LAPAROSCOPIC SALPINGO OOPHORECTOMY (Right) CYSTOSCOPY (N/A)  SURGEON:  Surgeon(s) and Role:    * Malachy Mood, MD - Primary    * Gae Dry, MD  PHYSICIAN ASSISTANT:   ASSISTANTS: Teresa Coombs MD   ANESTHESIA:   general  EBL:  Total I/O In: 1000 [I.V.:1000] Out: 500 [Urine:500]  BLOOD ADMINISTERED:none  DRAINS: foley catheter   LOCAL MEDICATIONS USED:  NONE  SPECIMEN:  Source of Specimen:  uterus, cervix, right & left tube, right ovary  DISPOSITION OF SPECIMEN:  PATHOLOGY  COUNTS:  YES  TOURNIQUET:  * No tourniquets in log *  DICTATION: .Note written in EPIC  PLAN OF CARE: Admit to inpatient   PATIENT DISPOSITION:  PACU - hemodynamically stable.   Delay start of Pharmacological VTE agent (>24hrs) due to surgical blood loss or risk of bleeding: yes

## 2015-05-12 NOTE — Anesthesia Postprocedure Evaluation (Signed)
  Anesthesia Post-op Note  Patient: Felicia Barker  Procedure(s) Performed: Procedure(s): HYSTERECTOMY TOTAL LAPAROSCOPIC (N/A) LAPAROSCOPIC SALPINGO OOPHORECTOMY (Right) CYSTOSCOPY (N/A)  Anesthesia type:General  Patient location: PACU  Post pain: Pain level controlled  Post assessment: Post-op Vital signs reviewed, Patient's Cardiovascular Status Stable, Respiratory Function Stable, Patent Airway and No signs of Nausea or vomiting  Post vital signs: Reviewed and stable  Last Vitals:  Filed Vitals:   05/12/15 1143  BP: 115/49  Pulse: 56  Temp: 37.4 C  Resp: 22    Level of consciousness: awake, alert  and patient cooperative  Complications: No apparent anesthesia complications

## 2015-05-12 NOTE — Anesthesia Preprocedure Evaluation (Addendum)
Anesthesia Evaluation  Patient identified by MRN, date of birth, ID band Patient awake    Reviewed: Allergy & Precautions, NPO status , Patient's Chart, lab work & pertinent test results  History of Anesthesia Complications Negative for: history of anesthetic complications  Airway Mallampati: II       Dental no notable dental hx. (+) Teeth Intact   Pulmonary neg pulmonary ROS,    Pulmonary exam normal       Cardiovascular hypertension, Pt. on home beta blockers Normal cardiovascular examRhythm:Regular Rate:Normal     Neuro/Psych Anxiety negative neurological ROS  negative psych ROS   GI/Hepatic negative GI ROS, Neg liver ROS,   Endo/Other  negative endocrine ROS  Renal/GU negative Renal ROS  negative genitourinary   Musculoskeletal negative musculoskeletal ROS (+)   Abdominal (+) + obese,  Abdomen: soft.    Peds negative pediatric ROS (+)  Hematology negative hematology ROS (+)   Anesthesia Other Findings   Reproductive/Obstetrics negative OB ROS                            Anesthesia Physical Anesthesia Plan  ASA: III  Anesthesia Plan: General   Post-op Pain Management:    Induction: Intravenous  Airway Management Planned: Oral ETT  Additional Equipment:   Intra-op Plan:   Post-operative Plan: Extubation in OR  Informed Consent: I have reviewed the patients History and Physical, chart, labs and discussed the procedure including the risks, benefits and alternatives for the proposed anesthesia with the patient or authorized representative who has indicated his/her understanding and acceptance.     Plan Discussed with: CRNA and Surgeon  Anesthesia Plan Comments:         Anesthesia Quick Evaluation

## 2015-05-12 NOTE — Progress Notes (Signed)
  Subjective:  Doing well other than some mild nausea, pain well controlled  Objective:  Blood pressure 126/67, pulse 70, temperature 98.7 F (37.1 C), temperature source Oral, resp. rate 20, height 5\' 6"  (1.676 m), weight 104.781 kg (231 lb), SpO2 100 %.  General: NAD Pulmonary: no increased work of breathing Abdomen: NABS, on-distended, nappropriately tender Incision: D/C/I Extremities: no edema, no erythema, no tenderness  Results for orders placed or performed during the hospital encounter of 05/12/15 (from the past 72 hour(s))  Pregnancy, urine POC     Status: None   Collection Time: 05/12/15  8:52 AM  Result Value Ref Range   Preg Test, Ur NEGATIVE NEGATIVE    Comment:        THE SENSITIVITY OF THIS METHODOLOGY IS >24 mIU/mL      Assessment:   44 y.o. No obstetric history on file. postoperativeday #0 TLH, RSO, cystoscopy   Plan:  1)Continue PCA overnight, advance to po analgesics tomorrow.  Case and findings discussed with patient and husband  2) Disposition - anticpate D/C tomorrow AM

## 2015-05-12 NOTE — H&P (Signed)
  Date of Initial H&P: 05/07/15  History reviewed, patient examined, no change in status, stable for surgery.

## 2015-05-13 ENCOUNTER — Encounter: Payer: Self-pay | Admitting: Obstetrics and Gynecology

## 2015-05-13 DIAGNOSIS — N800 Endometriosis of uterus: Secondary | ICD-10-CM | POA: Diagnosis not present

## 2015-05-13 LAB — BASIC METABOLIC PANEL
Anion gap: 5 (ref 5–15)
BUN: 11 mg/dL (ref 6–20)
CO2: 26 mmol/L (ref 22–32)
Calcium: 8 mg/dL — ABNORMAL LOW (ref 8.9–10.3)
Chloride: 106 mmol/L (ref 101–111)
Creatinine, Ser: 0.95 mg/dL (ref 0.44–1.00)
GFR calc non Af Amer: >60 mL/min (ref >60)
GLUCOSE: 133 mg/dL — AB (ref 65–99)
Potassium: 4.2 mmol/L (ref 3.5–5.1)
Sodium: 137 mmol/L (ref 135–145)

## 2015-05-13 LAB — CBC
HCT: 34.7 % — ABNORMAL LOW (ref 35.0–47.0)
HEMOGLOBIN: 11.7 g/dL — AB (ref 12.0–16.0)
MCH: 29.5 pg (ref 26.0–34.0)
MCHC: 33.7 g/dL (ref 32.0–36.0)
MCV: 87.5 fL (ref 80.0–100.0)
Platelets: 211 10*3/uL (ref 150–440)
RBC: 3.97 MIL/uL (ref 3.80–5.20)
RDW: 13.2 % (ref 11.5–14.5)
WBC: 16.1 10*3/uL — ABNORMAL HIGH (ref 3.6–11.0)

## 2015-05-13 LAB — SURGICAL PATHOLOGY

## 2015-05-13 MED ORDER — OXYCODONE-ACETAMINOPHEN 5-325 MG PO TABS: 1.0000 | ORAL_TABLET | ORAL | Status: DC | PRN

## 2015-05-13 MED ORDER — IBUPROFEN 600 MG PO TABS
600.0000 mg | ORAL_TABLET | Freq: Four times a day (QID) | ORAL | Status: DC | PRN
  Administered 2015-05-13: 600 mg via ORAL
  Filled 2015-05-13: qty 1

## 2015-05-13 MED ORDER — IBUPROFEN 600 MG PO TABS: 600.0000 mg | ORAL_TABLET | Freq: Four times a day (QID) | ORAL | Status: DC | PRN

## 2015-05-13 MED ORDER — OXYCODONE-ACETAMINOPHEN 5-325 MG PO TABS
1.0000 | ORAL_TABLET | ORAL | Status: DC | PRN
  Administered 2015-05-13 (×2): 2 via ORAL
  Administered 2015-05-13: 1 via ORAL
  Filled 2015-05-13: qty 1
  Filled 2015-05-13 (×2): qty 2

## 2015-05-13 MED ORDER — BISOPROLOL FUMARATE 5 MG PO TABS
5.0000 mg | ORAL_TABLET | Freq: Once | ORAL | Status: AC
  Administered 2015-05-13: 5 mg via ORAL
  Filled 2015-05-13: qty 1

## 2015-05-13 NOTE — Progress Notes (Signed)
Patient discharged home. Vital signs stable, incisions WNL, pain well controlled with PO medicines, eating and ambulating without issues. Discharge instructions, prescriptions, and follow up appointment given to and reviewed with patient. Patient verbalized understanding, all questions answered. Escorted in wheelchair by nursing.

## 2015-05-13 NOTE — Discharge Summary (Signed)
Gynecology Physician Postoperative Discharge Summary  Patient ID: Felicia Barker MRN: [id number] DOB/AGE: Nov 10, 1971 44 y.o.  Admit Date: 05/12/2015 Discharge Date: 05/13/2015  Preoperative Diagnoses: Chronic Pelvic Pain  Procedures: Procedure(s) (LRB): HYSTERECTOMY TOTAL LAPAROSCOPIC (N/A) LAPAROSCOPIC SALPINGO OOPHORECTOMY (Right) CYSTOSCOPY (N/A)  CBC Latest Ref Rng 05/13/2015 05/07/2015 01/22/2015  WBC 3.6 - 11.0 K/uL 16.1(H) 9.4 11.6(H)  Hemoglobin 12.0 - 16.0 g/dL 11.7(L) 13.6 13.8  Hematocrit 35.0 - 47.0 % 34.7(L) 40.7 41.4  Platelets 150 - 440 K/uL 211 241 263    Hospital Course:  Felicia Barker is a 44 y.o. No obstetric history on file.  admitted for scheduled surgery.  She underwent the procedures as mentioned above, her operation was uncomplicated. For further details about surgery, please refer to the operative report. Patient had an uncomplicated postoperative course. By time of discharge on POD#1, her pain was controlled on oral pain medications; she was ambulating, voiding without difficulty, tolerating regular diet, and hemodynamically stable. Vital signs, CBC and BMP were appropriate in the postoperative setting from preoperative baselines.  She was deemed stable for discharge to home.   Discharged Condition: Stable  Disposition: Final discharge disposition not confirmed  Follow-up Information    Follow up with Dorthula Nettles, MD. Schedule an appointment as soon as possible for a visit in 1 week.   Specialty:  Obstetrics and Gynecology   Why:  For wound re-check   Contact information:   [address] [place] [address] [phone]       Discharge Instructions    Call MD for:  difficulty breathing, headache or visual disturbances    Complete by:  As directed      Call MD for:  persistant dizziness or light-headedness    Complete by:  As directed      Call MD for:  persistant nausea and vomiting    Complete by:  As directed      Call MD for:  severe  uncontrolled pain    Complete by:  As directed      Call MD for:  temperature >100.4    Complete by:  As directed      Diet - low sodium heart healthy    Complete by:  As directed      Discharge instructions    Complete by:  As directed   Call for heavy bleeding, fevers above 100.4, severe nausea, or pain that is not controlled on you narcotic prescription     Driving Restrictions    Complete by:  As directed   No driving for 2 weeks     Increase activity slowly    Complete by:  As directed      Lifting restrictions    Complete by:  As directed   No lifting greater than 10lbs for the full 6 weeks     No dressing needed    Complete by:  As directed      Sexual Activity Restrictions    Complete by:  As directed   Nothing per vagina for 6 weeks            Medication List    STOP taking these medications        naproxen 250 MG tablet  Commonly known as:  NAPROSYN      TAKE these medications        bisoprolol-hydrochlorothiazide 5-6.25 MG per tablet  Commonly known as:  ZIAC  Take 1 tablet by mouth every morning.     cetirizine 10 MG  tablet  Commonly known as:  ZYRTEC  Take 10 mg by mouth every morning.     ibuprofen 600 MG tablet  Commonly known as:  ADVIL,MOTRIN  Take 1 tablet (600 mg total) by mouth every 6 (six) hours as needed for fever or headache.     oxyCODONE-acetaminophen 5-325 MG per tablet  Commonly known as:  PERCOCET/ROXICET  Take 1-2 tablets by mouth every 4 (four) hours as needed for severe pain.

## 2015-05-13 NOTE — Discharge Instructions (Signed)
Total Laparoscopic Hysterectomy, Care After Refer to this sheet in the next few weeks. These instructions provide you with information on caring for yourself after your procedure. Your health care provider may also give you more specific instructions. Your treatment has been planned according to current medical practices, but problems sometimes occur. Call your health care provider if you have any problems or questions after your procedure.  WHAT TO EXPECT AFTER THE PROCEDURE  Pain and bruising at the incision sites. You will be given pain medicine to control it.  Menopausal symptoms such as hot flashes, night sweats, and insomnia if your ovaries were removed.  Sore throat from the breathing tube that was inserted during surgery.  HOME CARE INSTRUCTIONS  Only take over-the-counter or prescription medicines for pain, discomfort, or fever as directed by your health care provider.   Do not take aspirin. It can cause bleeding.   Do not drive when taking pain medicine.   Follow your health care provider's advice regarding diet, exercise, lifting, driving, and general activities.   Resume your usual diet as directed and allowed.   Get plenty of rest and sleep.   Do not douche, use tampons, or have sexual intercourse for at least 6 weeks, or until your health care provider gives you permission.   Change your bandages (dressings) as directed by your health care provider.   Monitor your temperature and notify your health care provider of a fever.   Take showers instead of baths for 2-3 weeks.   Do not drink alcohol until your health care provider gives you permission.   If you develop constipation, you may take a mild laxative with your health care provider's permission. Bran foods may help with constipation problems. Drinking enough fluids to keep your urine clear or pale yellow may help as well.   Try to have someone home with you for 1-2 weeks to help around the house.    Keep all of your follow-up appointments as directed by your health care provider.   SEEK MEDICAL CARE IF:  You have swelling, redness, or increasing pain around your incision sites.   You have pus coming from your incision.   You notice a bad smell coming from your incision.   Your incision breaks open.   You feel dizzy or lightheaded.   You have pain or bleeding when you urinate.   You have persistent diarrhea.   You have persistent nausea and vomiting.   You have abnormal vaginal discharge.   You have a rash.   You have any type of abnormal reaction or develop an allergy to your medicine.   You have poor pain control with your prescribed medicine.   SEEK IMMEDIATE MEDICAL CARE IF:  You have chest pain or shortness of breath.  You have severe abdominal pain that is not relieved with pain medicine.  You have pain or swelling in your legs.  MAKE SURE YOU:  Understand these instructions.  Will watch your condition.  Will get help right away if you are not doing well or get worse.

## 2015-05-15 ENCOUNTER — Encounter: Payer: Self-pay | Admitting: Obstetrics and Gynecology

## 2015-12-29 ENCOUNTER — Other Ambulatory Visit: Payer: Self-pay | Admitting: Physician Assistant

## 2015-12-30 ENCOUNTER — Other Ambulatory Visit: Payer: Self-pay | Admitting: Physician Assistant

## 2015-12-30 DIAGNOSIS — I10 Essential (primary) hypertension: Secondary | ICD-10-CM

## 2016-05-03 ENCOUNTER — Other Ambulatory Visit: Payer: Self-pay | Admitting: Family Medicine

## 2016-05-03 DIAGNOSIS — Z1231 Encounter for screening mammogram for malignant neoplasm of breast: Secondary | ICD-10-CM

## 2016-05-19 ENCOUNTER — Ambulatory Visit
Admission: RE | Admit: 2016-05-19 | Discharge: 2016-05-19 | Disposition: A | Discharge status: Home / Self Care | Payer: BLUE CROSS/BLUE SHIELD | Source: Ambulatory Visit | Attending: Family Medicine | Admitting: Family Medicine

## 2016-05-19 ENCOUNTER — Other Ambulatory Visit: Payer: Self-pay | Admitting: Family Medicine

## 2016-05-19 DIAGNOSIS — Z1231 Encounter for screening mammogram for malignant neoplasm of breast: Secondary | ICD-10-CM

## 2017-01-09 ENCOUNTER — Other Ambulatory Visit: Payer: Self-pay | Admitting: Physician Assistant

## 2017-01-09 DIAGNOSIS — I10 Essential (primary) hypertension: Secondary | ICD-10-CM

## 2017-01-11 ENCOUNTER — Other Ambulatory Visit: Payer: Self-pay | Admitting: Physician Assistant

## 2017-01-11 ENCOUNTER — Other Ambulatory Visit: Payer: Self-pay

## 2017-01-11 DIAGNOSIS — I10 Essential (primary) hypertension: Secondary | ICD-10-CM

## 2017-01-11 NOTE — Telephone Encounter (Signed)
ERROR. sd

## 2017-06-28 ENCOUNTER — Other Ambulatory Visit: Payer: Self-pay | Admitting: Family Medicine

## 2017-06-28 DIAGNOSIS — Z1231 Encounter for screening mammogram for malignant neoplasm of breast: Secondary | ICD-10-CM

## 2017-07-18 ENCOUNTER — Ambulatory Visit
Admission: RE | Admit: 2017-07-18 | Discharge: 2017-07-18 | Disposition: A | Discharge status: Home / Self Care | Payer: BLUE CROSS/BLUE SHIELD | Source: Ambulatory Visit | Attending: Family Medicine | Admitting: Family Medicine

## 2017-07-18 DIAGNOSIS — Z1231 Encounter for screening mammogram for malignant neoplasm of breast: Secondary | ICD-10-CM

## 2017-10-15 ENCOUNTER — Emergency Department: Payer: BLUE CROSS/BLUE SHIELD

## 2017-10-15 ENCOUNTER — Encounter: Payer: Self-pay | Admitting: *Deleted

## 2017-10-15 ENCOUNTER — Emergency Department
Admission: EM | Admit: 2017-10-15 | Discharge: 2017-10-16 | Disposition: A | Discharge status: Home / Self Care | Payer: BLUE CROSS/BLUE SHIELD | Attending: Emergency Medicine | Admitting: Emergency Medicine

## 2017-10-15 DIAGNOSIS — M546 Pain in thoracic spine: Secondary | ICD-10-CM | POA: Diagnosis not present

## 2017-10-15 DIAGNOSIS — I10 Essential (primary) hypertension: Secondary | ICD-10-CM | POA: Diagnosis not present

## 2017-10-15 DIAGNOSIS — R0789 Other chest pain: Secondary | ICD-10-CM | POA: Insufficient documentation

## 2017-10-15 DIAGNOSIS — M549 Dorsalgia, unspecified: Secondary | ICD-10-CM

## 2017-10-15 DIAGNOSIS — Z79899 Other long term (current) drug therapy: Secondary | ICD-10-CM | POA: Insufficient documentation

## 2017-10-15 LAB — BASIC METABOLIC PANEL
ANION GAP: 11 (ref 5–15)
BUN: 20 mg/dL (ref 6–20)
CO2: 20 mmol/L — ABNORMAL LOW (ref 22–32)
Calcium: 8.9 mg/dL (ref 8.9–10.3)
Chloride: 107 mmol/L (ref 101–111)
Creatinine, Ser: 0.85 mg/dL (ref 0.44–1.00)
GLUCOSE: 121 mg/dL — AB (ref 65–99)
POTASSIUM: 3.9 mmol/L (ref 3.5–5.1)
Sodium: 138 mmol/L (ref 135–145)

## 2017-10-15 LAB — CBC
HEMATOCRIT: 41 % (ref 35.0–47.0)
HEMOGLOBIN: 13.5 g/dL (ref 12.0–16.0)
MCH: 28.9 pg (ref 26.0–34.0)
MCHC: 33 g/dL (ref 32.0–36.0)
MCV: 87.8 fL (ref 80.0–100.0)
Platelets: 257 10*3/uL (ref 150–440)
RBC: 4.67 MIL/uL (ref 3.80–5.20)
RDW: 13.4 % (ref 11.5–14.5)
WBC: 9.5 10*3/uL (ref 3.6–11.0)

## 2017-10-15 LAB — TROPONIN I

## 2017-10-15 LAB — LIPASE, BLOOD: Lipase: 22 U/L (ref 11–51)

## 2017-10-15 MED ORDER — ONDANSETRON HCL 4 MG/2ML IJ SOLN
4.0000 mg | INTRAMUSCULAR | Status: AC
  Administered 2017-10-16: 4 mg via INTRAVENOUS
  Filled 2017-10-15: qty 2

## 2017-10-15 MED ORDER — MORPHINE SULFATE (PF) 4 MG/ML IV SOLN
4.0000 mg | Freq: Once | INTRAVENOUS | Status: AC
  Administered 2017-10-16: 4 mg via INTRAVENOUS
  Filled 2017-10-15: qty 1

## 2017-10-15 NOTE — ED Triage Notes (Signed)
Patient with complaint of central chest pain and shortness of breath that started yesterday. Patient states that the pain has become worse today.

## 2017-10-15 NOTE — ED Provider Notes (Signed)
[facility] Emergency Department Provider Note  ____________________________________________   First MD Initiated Contact with Patient 10/15/17 2318     (approximate)  I have reviewed the triage vital signs and the nursing notes.   HISTORY  Chief Complaint Chest Pain    HPI Felicia Barker is a 46 y.o. female with medical and surgical history as listed below who presents for evaluation of about 24 hours of initially intermittent but now constant pain in the left-middle upper part of her back that occasionally radiates through to her chest.  She describes it as severe, pressure-like, and occasionally sharp.  It is worse with movement of her left arm and shoulder and with deep breaths.  It is slightly better with rest.  It started after she helped her husband move forward but until I ask about it she did not consider that she may have pulled a muscle or strained herself because she did not feel anything at the time and it was not particularly heavy.  She is not having trouble breathing, just has worse pain with deep breaths.  She denies fever/chills, cough, difficulty breathing, nausea, vomiting, abdominal pain, and dysuria.  She said the only thing been similar to what she has experienced in the past was when she had her gallbladder disease and cholecystectomy about 7 years ago, but this still does not feel the same.  She reports having a history of a blood clot in her foot years ago but she cannot remember if it was called a DVT or not.  She did not go on blood thinners but did take aspirin for a period of time.  She does take hormonal replacement medications after her oophorectomy but does not remember which hormones she takes.  She has no history of pulmonary embolism.  She is a non-smoker.  She has not been immobilized recently or had surgery or been on any long trips.  Past Medical History:  Diagnosis Date  . Anxiety    with loss of Dad  . Hypertension   . Kidney  function abnormal    with left ovary /ablatiion surgery  . Seasonal allergies     Patient Active Problem List   Diagnosis Date Noted  . Chronic pelvic pain in female 05/12/2015  . S/P hysterectomy with oophorectomy 05/12/2015    Past Surgical History:  Procedure Laterality Date  . ABDOMINAL HYSTERECTOMY    . ABLATION  oct 2014  . CHOLECYSTECTOMY    . CYSTOSCOPY N/A 05/12/2015   Procedure: CYSTOSCOPY;  Surgeon: Malachy Mood, MD;  Location: [facility] ORS;  Service: Gynecology;  Laterality: N/A;  . DIAGNOSTIC LAPAROSCOPY  left ovary  . DILATION AND CURETTAGE OF UTERUS     leep  . LAPAROSCOPIC HYSTERECTOMY N/A 05/12/2015   Procedure: HYSTERECTOMY TOTAL LAPAROSCOPIC;  Surgeon: Malachy Mood, MD;  Location: [facility] ORS;  Service: Gynecology;  Laterality: N/A;  . LAPAROSCOPIC SALPINGO OOPHERECTOMY Right 05/12/2015   Procedure: LAPAROSCOPIC SALPINGO OOPHORECTOMY;  Surgeon: Malachy Mood, MD;  Location: [facility] ORS;  Service: Gynecology;  Laterality: Right;    Prior to Admission medications   Medication Sig Start Date End Date Taking? Authorizing Provider  bisoprolol-hydrochlorothiazide [facility]) 5-6.25 MG tablet TAKE ONE TABLET BY MOUTH IN THE MORNING 12/30/15   Mar Daring, PA-C  cetirizine (ZYRTEC) 10 MG tablet Take 10 mg by mouth every morning.    [provider]  cyclobenzaprine (FLEXERIL) 5 MG tablet Take 1 tablet (5 mg total) by mouth 3 (three) times daily as needed for  muscle spasms. 10/16/17   Hinda Kehr, MD  ibuprofen (ADVIL,MOTRIN) 600 MG tablet Take 1 tablet (600 mg total) by mouth every 6 (six) hours as needed for fever or headache. 05/13/15   Malachy Mood, MD  oxyCODONE-acetaminophen (PERCOCET/ROXICET) 5-325 MG per tablet Take 1-2 tablets by mouth every 4 (four) hours as needed for severe pain. Patient not taking: Reported on 10/15/2017 05/13/15   Malachy Mood, MD    Allergies Ampicillin; Apple; Cherry; Peach [prunus persica]; and Sulfa  antibiotics  Family History  Problem Relation Age of Onset  . Breast cancer Maternal Aunt 60       2nd onset at age 70  . Breast cancer Maternal Grandmother 80  . Breast cancer Cousin 63    Social History Social History  Substance Use Topics  . Smoking status: Never Smoker  . Smokeless tobacco: Never Used  . Alcohol use No    Review of Systems Constitutional: No fever/chills Eyes: No visual changes. ENT: No sore throat. Cardiovascular: chest pain thought to radiate from the back Respiratory: Denies difficulty breathing. Pain with deep breaths.  No wheezing, no cough Gastrointestinal: No abdominal pain.  No nausea, no vomiting.  No diarrhea.  No constipation. Genitourinary: Negative for dysuria. Musculoskeletal: Pressure-like and sharp pain to the left of middle of her upper back radiating through to the chest, worse with range of motion of the left upper extremity and deep breaths Integumentary: Negative for rash. Neurological: Negative for headaches, focal weakness or numbness.   ____________________________________________   PHYSICAL EXAM:  VITAL SIGNS: ED Triage Vitals  Enc Vitals Group     BP 10/15/17 2032 (!) 151/91     Pulse Rate 10/15/17 2032 74     Resp 10/15/17 2032 18     Temp 10/15/17 2032 97.8 F (36.6 C)     Temp src --      SpO2 10/15/17 2032 96 %     Weight 10/15/17 2012 99.8 kg (220 lb)     Height 10/15/17 2012 1.676 m (5\' 6" )     Head Circumference --      Peak Flow --      Pain Score 10/15/17 2012 5     Pain Loc --      Pain Edu? --      Excl. in [place]? --     Constitutional: Alert and oriented.  Generally well-appearing but does appear uncomfortable and winces every few seconds as a sharp pain or spasm hits her Eyes: Conjunctivae are normal.  Head: Atraumatic. Nose: No congestion/rhinnorhea. Mouth/Throat: Mucous membranes are moist. Neck: No stridor.  No meningeal signs.   Cardiovascular: Normal rate, regular rhythm. Good peripheral  circulation. Grossly normal heart sounds. Respiratory: Normal respiratory effort.  No retractions. Lungs CTAB. Gastrointestinal: Soft and nontender. No distention.  Musculoskeletal: Slightly reproducible tenderness to palpation in the left scapular region, but easily reproducible back pain/tenderness with range of motion of her left upper extremity.  No limitation of range of motion, just causes discomfort.  No gross deformities or obvious injuries are present on her back, no erythema, induration, nor fluctuance.  No tenderness to palpation of the cervical or thoracic spine. Neurologic:  Normal speech and language. No gross focal neurologic deficits are appreciated.  Skin:  Skin is warm, dry and intact. No rash noted. Psychiatric: Mood and affect are normal. Speech and behavior are normal.  ____________________________________________   LABS (all labs ordered are listed, but only abnormal results are displayed)  Labs Reviewed  BASIC METABOLIC PANEL -  Abnormal; Notable for the following:       Result Value   CO2 20 (*)    Glucose, Bld 121 (*)    All other components within normal limits  FIBRIN DERIVATIVES D-DIMER ([facility] ONLY) - Abnormal; Notable for the following:    Fibrin derivatives D-dimer ([facility]) 553.09 (*)    All other components within normal limits  HEPATIC FUNCTION PANEL - Abnormal; Notable for the following:    Albumin 3.3 (*)    Total Bilirubin 0.2 (*)    Bilirubin, Direct <0.1 (*)    All other components within normal limits  CBC  TROPONIN I  LIPASE, BLOOD   ____________________________________________  EKG  ED ECG REPORT I, Elson Ulbrich, the attending physician, personally viewed and interpreted this ECG.  Date: 10/15/2017 EKG Time: 20: 09 Rate: 75 Rhythm: normal sinus rhythm QRS Axis: normal Intervals: normal ST/T Wave abnormalities: Non-specific ST segment / T-wave changes, but no evidence of acute ischemia.  Inverted T-wave in lead III. Narrative  Interpretation: no evidence of acute ischemia, and the inverted T wave in lead III was present and an EKG from 2016   ____________________________________________  RADIOLOGY   Dg Chest 2 View  Result Date: 10/15/2017 CLINICAL DATA:  Chest pain EXAM: CHEST  2 VIEW COMPARISON:  CT 04/26/2017 FINDINGS: No acute pulmonary infiltrate, consolidation or effusion. Mild bronchitic changes. Normal heart size. No pneumothorax. Surgical clips in the upper abdomen IMPRESSION: No active cardiopulmonary disease.  Mild bronchitic changes Electronically Signed   By: Donavan Foil M.D.   On: 10/15/2017 21:13   Ct Angio Chest Pe W/cm &/or Wo Cm  Result Date: 10/16/2017 CLINICAL DATA:  Central chest pain and shortness of breath. EXAM: CT ANGIOGRAPHY CHEST WITH CONTRAST TECHNIQUE: Multidetector CT imaging of the chest was performed using the standard protocol during bolus administration of intravenous contrast. Multiplanar CT image reconstructions and MIPs were obtained to evaluate the vascular anatomy. CONTRAST:  75 cc Isovue 370 IV COMPARISON:  Chest radiograph yesterday.  Chest CT 04/27/2011 FINDINGS: Cardiovascular: There are no filling defects within the pulmonary arteries to suggest pulmonary embolus. Normal caliber thoracic aorta without dissection. Heart is normal in size. No pericardial effusion. Mediastinum/Nodes: No mediastinal, hilar, or axillary adenopathy. Esophagus is decompressed. Visualized thyroid gland is normal. Lungs/Pleura: Minimal subsegmental atelectasis in the right lower lobe. Lungs are otherwise clear. No consolidation or pulmonary edema. No pleural fluid. Upper Abdomen: Cholecystectomy clips.  No acute abnormality. Musculoskeletal: There are no acute or suspicious osseous abnormalities. Review of the MIP images confirms the above findings. IMPRESSION: No pulmonary embolus or acute intrathoracic abnormality. Electronically Signed   By: Jeb Levering M.D.   On: 10/16/2017 02:53     ____________________________________________   PROCEDURES  Critical Care performed: No   Procedure(s) performed:   Procedures   ____________________________________________   INITIAL IMPRESSION / ASSESSMENT AND PLAN / ED COURSE  As part of my medical decision making, I reviewed the following data within the [place] notes reviewed and incorporated, Labs reviewed , EKG interpreted , Old EKG reviewed, Old chart reviewed and Radiograph reviewed     Differential diagnosis includes, but is not limited to, ACS, aortic dissection, pulmonary embolism, cardiac tamponade, pneumothorax, pneumonia, pericarditis/myocarditis, GI-related causes including esophagitis/gastritis, and musculoskeletal chest wall / back pain.  Patient is low risk for ACS based on HEART score.  Slightly more concerned about the stability of pulmonary embolism; the patient is on some sort of hormonal replacement (cannot find evidence of which  kind in the medical record) and apparently has had a blood clot in her foot, although it was likely superficial given that she never was put on blood thinners.  My top diagnosis is musculoskeletal pain, but I feel that although she is low risk for pulmonary embolism we should attempt to rule it out by d-dimer.  I had my usual and customary d-dimer discussion with the patient and explained that if her value is even slightly elevated we will need to obtain a CTA of her chest and she understands and agrees.  Morphine and Zofran for symptomatic relief.  Reviewed her chest x-ray and I agree with the radiologist report which does not report any acute abnormalities.  There is no evidence of widened mediastinum and I think that aortic dissection is very unlikely to explain her current symptoms.  Retained gallstone is unlikely based on the fact that she had her cholecystectomy 7 years ago but I have added on hepatic function panel to her lab work.  Lab work was  reassuring with a negative troponin, normal CBC and essentially normal BMP and lipase.  EKG unchanged from prior.  I will follow-up with the d-dimer and check on her symptoms relief after treatment and order CTA chest if necessary.  Clinical Course as of Oct 16 341  Mon Oct 16, 2017  0155 D-dimer is very slightly elevated above the upper limit of normal but still considered positive.  I will update the patient and order the CT Angio chest Fibrin derivatives D-dimer [facility]): (!) 553.09 [CF]  9629 CTA chest is reassuring. CT Angio Chest PE W/Cm &/Or Wo Cm [CF]  5284 I updated the patient with the results of her CTA.  At this point the patient, her husband, and I are all comfortable with the probability that this is musculoskeletal pain and tenderness.  She feels better after the morphine although she is still having some aching or spasming pain.  I would get a provide a Flexeril here and a prescription and encourage the use of NSAIDs.  I will also give her a dose of Toradol here in the emergency department.  I encouraged her to follow-up with the next available opportunity with her regular provider gave my usual and customary return precautions.  She understands and agrees with the plan.  There is no indication to check a second troponin given that she is very low risk for ACS.  [CF]    Clinical Course User Index [CF] Hinda Kehr, MD    ____________________________________________  FINAL CLINICAL IMPRESSION(S) / ED DIAGNOSES  Final diagnoses:  Upper back pain on left side  Atypical chest pain     MEDICATIONS GIVEN DURING THIS VISIT:  Medications  ketorolac (TORADOL) 30 MG/ML injection 15 mg (not administered)  cyclobenzaprine (FLEXERIL) tablet 10 mg (not administered)  morphine 4 MG/ML injection 4 mg (4 mg Intravenous Given 10/16/17 0003)  ondansetron (ZOFRAN) injection 4 mg (4 mg Intravenous Given 10/16/17 0003)  iopamidol (ISOVUE-370) 76 % injection 75 mL (75 mLs Intravenous Contrast  Given 10/16/17 0232)     NEW OUTPATIENT MEDICATIONS STARTED DURING THIS VISIT:  New Prescriptions   CYCLOBENZAPRINE (FLEXERIL) 5 MG TABLET    Take 1 tablet (5 mg total) by mouth 3 (three) times daily as needed for muscle spasms.    Modified Medications   No medications on file    Discontinued Medications   No medications on file     Note:  This document was prepared using Dragon voice recognition software and  may include unintentional dictation errors.    Hinda Kehr, MD 10/16/17 [phone]

## 2017-10-15 NOTE — ED Triage Notes (Signed)
Pt reports mid back pain and chest pain.  Sx began yesterday.  No n/v/d.  No diaphoresis.  Nonsmoker no cough  No fever.  Pt alert   Speech clear.

## 2017-10-15 NOTE — ED Notes (Signed)
Patient transported to X-ray 

## 2017-10-16 ENCOUNTER — Encounter: Payer: Self-pay | Admitting: Radiology

## 2017-10-16 ENCOUNTER — Emergency Department: Payer: BLUE CROSS/BLUE SHIELD

## 2017-10-16 LAB — HEPATIC FUNCTION PANEL
ALT: 19 U/L (ref 14–54)
AST: 31 U/L (ref 15–41)
Albumin: 3.3 g/dL — ABNORMAL LOW (ref 3.5–5.0)
Alkaline Phosphatase: 61 U/L (ref 38–126)
BILIRUBIN TOTAL: 0.2 mg/dL — AB (ref 0.3–1.2)
Total Protein: 7.2 g/dL (ref 6.5–8.1)

## 2017-10-16 LAB — FIBRIN DERIVATIVES D-DIMER (ARMC ONLY): Fibrin derivatives D-dimer (ARMC): 553.09 ng/mL (FEU) — ABNORMAL HIGH (ref 0.00–499.00)

## 2017-10-16 MED ORDER — CYCLOBENZAPRINE HCL 10 MG PO TABS
5.0000 mg | ORAL_TABLET | Freq: Once | ORAL | Status: DC
  Filled 2017-10-16: qty 1

## 2017-10-16 MED ORDER — CYCLOBENZAPRINE HCL 5 MG PO TABS: 5.0000 mg | ORAL_TABLET | Freq: Three times a day (TID) | ORAL | 0 refills | Status: DC | PRN

## 2017-10-16 MED ORDER — IOPAMIDOL (ISOVUE-370) INJECTION 76%
75.0000 mL | Freq: Once | INTRAVENOUS | Status: AC | PRN
  Administered 2017-10-16: 75 mL via INTRAVENOUS

## 2017-10-16 MED ORDER — CYCLOBENZAPRINE HCL 10 MG PO TABS
10.0000 mg | ORAL_TABLET | Freq: Once | ORAL | Status: AC
  Administered 2017-10-16: 10 mg via ORAL

## 2017-10-16 MED ORDER — KETOROLAC TROMETHAMINE 30 MG/ML IJ SOLN
15.0000 mg | Freq: Once | INTRAMUSCULAR | Status: AC
  Administered 2017-10-16: 15 mg via INTRAVENOUS
  Filled 2017-10-16: qty 1

## 2017-10-16 NOTE — Discharge Instructions (Signed)

## 2017-10-16 NOTE — ED Notes (Signed)
Pt assisted to the bathroom. Pt ambulated independently without any difficulty.

## 2019-05-01 ENCOUNTER — Other Ambulatory Visit: Payer: Self-pay | Admitting: Family Medicine

## 2019-05-01 DIAGNOSIS — I10 Essential (primary) hypertension: Secondary | ICD-10-CM

## 2019-05-01 NOTE — Telephone Encounter (Signed)
Please review

## 2019-06-24 ENCOUNTER — Ambulatory Visit: Payer: 59 | Admitting: Internal Medicine

## 2019-06-24 ENCOUNTER — Encounter: Payer: Self-pay | Admitting: Internal Medicine

## 2019-06-24 ENCOUNTER — Other Ambulatory Visit: Payer: Self-pay

## 2019-06-24 VITALS — BP 146/98 | HR 77 | Temp 97.6°F | Resp 16 | Ht 66.5 in | Wt 245.0 lb

## 2019-06-24 DIAGNOSIS — I10 Essential (primary) hypertension: Secondary | ICD-10-CM

## 2019-06-24 DIAGNOSIS — Z6838 Body mass index (BMI) 38.0-38.9, adult: Secondary | ICD-10-CM

## 2019-06-24 DIAGNOSIS — E6609 Other obesity due to excess calories: Secondary | ICD-10-CM

## 2019-06-24 DIAGNOSIS — F411 Generalized anxiety disorder: Secondary | ICD-10-CM

## 2019-06-24 DIAGNOSIS — L309 Dermatitis, unspecified: Secondary | ICD-10-CM

## 2019-06-24 NOTE — Progress Notes (Signed)
S - Patient presents with skin rash on her face, left sided and first noted last evening (had picture on her phone as was much better presently). Is better this morning and notes has extended some below her chin and to her right cheek area, had small bumps last evening, now more red and bumps gone and feels tightness in the face  No new products topically (except a new shampoo tried a week ago) No new exposures noted No Painful, a little itchy No SOB, throat closing feeling No vision concerns and eyes a little itchy, typical with her allergies No doc'ed fevers, has not felt feverish, not feeling ill Has applied anything topical    Current Outpatient Medications on File Prior to Visit  Medication Sig Dispense Refill  . bisoprolol-hydrochlorothiazide (ZIAC) 5-6.25 MG tablet TAKE ONE TABLET BY MOUTH IN THE MORNING 90 tablet 3  . cetirizine (ZYRTEC) 10 MG tablet Take 10 mg by mouth every morning.    . cyclobenzaprine (FLEXERIL) 5 MG tablet Take 1 tablet (5 mg total) by mouth 3 (three) times daily as needed for muscle spasms. 30 tablet 0  . estradiol (ESTRACE) 2 MG tablet Take 2 mg by mouth daily.    . naproxen (NAPROSYN) 500 MG tablet Take by mouth.     No current facility-administered medications on file prior to visit.      Allergies  Allergen Reactions  . Ampicillin Shortness Of Breath and Rash  . Apple Shortness Of Breath and Itching  . Cherry Shortness Of Breath and Itching  . Peach [Prunus Persica] Shortness Of Breath and Itching  . Sulfa Antibiotics Shortness Of Breath and Rash  . Amoxicillin   . Stadol [Butorphanol]    No tob history    O - NAD, not ill appearing, masked removed to examine, obese   BP (!) 146/98 (BP Location: Right Arm, Patient Position: Sitting, Cuff Size: Large)   Pulse 77   Temp 97.6 F (36.4 C) (Oral)   Resp 16   Ht 5' 6.5" (1.689 m)   Wt 245 lb (111.1 kg)   LMP 05/02/2015 (Exact Date)   SpO2 98%   BMI 38.95 kg/m    Recheck BP - 180/105 on  right with machine    HEENT - sclera ancteric, conj - non-inj'ed. PERRL, EOMI, nares patent, no nasal discharge, pharynx clear, oral mucosa clear Skin - erythematous areas, more splotchy over left cheek, chin and lesser on right cheek region, no raised lesions, no welts or blisters, no open areas of concern.  Neck - no rigidity, no adenopathy Car - RRR without m/g/r Pulm - CTA Abd - obese Ext - no LE edema Affect not flat, approp with conversation, no facial droop or facial muscle weakness     Ass/Plan - Facial rash - ? Heat induced component, possible allergic component most likely sources, no obvious contact source  Educated to likely sources Can use a  HC-1% topically sparingly as is the face noted and can also use a benadryl topically prn  Can use oral benadryl - 25 mg every 6 hours during the day and 50mg  at bedtime prn if increased itch (warned may make drowsy) Continue the non-sedating antihistamine in the am  Try to avoid heat to area short term noted, and not use any new products short term Follow-up if not better or worsening,  2. HTN - concern not as well controlled recent past, stress of Covid (with mask wearing an issue for her noted with claustrophobic concerns) and this  recent rash may be contributing  Continue current BP medication to manage,  Discussed goals for good control of BP  Trying to get home BP checks discussed, she thought she may have machine to check and rec'ed when this is better, getting a couple BP checks and present here for these as well if needed and if SBP > 140 or DBP > 90, needs to f/u and she understood.  3. Increased BMI - a likely factor with BP increase   4. Anxiety - some increases in recent past and noted may be contributing

## 2019-06-25 ENCOUNTER — Ambulatory Visit: Payer: Self-pay | Admitting: Internal Medicine

## 2019-07-02 ENCOUNTER — Encounter: Payer: Self-pay | Admitting: Internal Medicine

## 2019-07-02 ENCOUNTER — Other Ambulatory Visit: Payer: Self-pay

## 2019-07-02 ENCOUNTER — Ambulatory Visit: Payer: 59 | Admitting: Internal Medicine

## 2019-07-02 ENCOUNTER — Telehealth: Payer: Self-pay

## 2019-07-02 VITALS — BP 165/103 | HR 66 | Temp 97.9°F | Resp 16 | Ht 66.5 in | Wt 254.0 lb

## 2019-07-02 DIAGNOSIS — Z6841 Body Mass Index (BMI) 40.0 and over, adult: Secondary | ICD-10-CM

## 2019-07-02 DIAGNOSIS — R519 Headache, unspecified: Secondary | ICD-10-CM | POA: Insufficient documentation

## 2019-07-02 DIAGNOSIS — I10 Essential (primary) hypertension: Secondary | ICD-10-CM | POA: Insufficient documentation

## 2019-07-02 MED ORDER — VALSARTAN 80 MG PO TABS: 80.0000 mg | ORAL_TABLET | Freq: Every day | ORAL | 3 refills | Status: DC

## 2019-07-02 MED ORDER — LOSARTAN POTASSIUM 50 MG PO TABS: 50.0000 mg | ORAL_TABLET | Freq: Every day | ORAL | 3 refills | Status: DC

## 2019-07-02 NOTE — Progress Notes (Signed)
S - Patient with a h/o HTN who follows-up for BP check-up. Was higher on a recent visit noted, with her facial rash almost resolved completely (a couple bumps can feel) Has checked BP's at home and log brought to review, BP's have been high on home checks and had several to view (140's-168/90-110's, many in the 264'B diastolic) She brought her machine in today as well (older) Is taking BP med and not missing doses  No CP, SOB, notes always fatigued, + more HA's noted, no marked vision changes, no LE swelling, had a tingling feeling in her left UE noted, and had this in the past with a neuropathy concern noted,    No tob use  No reg exercise   Noted her and her husband are planning to try to start with diet modifications to lose weight   Current Outpatient Medications on File Prior to Visit  Medication Sig Dispense Refill  . bisoprolol-hydrochlorothiazide (ZIAC) 5-6.25 MG tablet TAKE ONE TABLET BY MOUTH IN THE MORNING 90 tablet 3  . cetirizine (ZYRTEC) 10 MG tablet Take 10 mg by mouth every morning.    . cyclobenzaprine (FLEXERIL) 5 MG tablet Take 1 tablet (5 mg total) by mouth 3 (three) times daily as needed for muscle spasms. 30 tablet 0  . estradiol (ESTRACE) 2 MG tablet Take 2 mg by mouth daily.    . naproxen (NAPROSYN) 500 MG tablet Take by mouth.     No current facility-administered medications on file prior to visit.    Allergies  Allergen Reactions  . Ampicillin Shortness Of Breath and Rash  . Apple Shortness Of Breath and Itching  . Cherry Shortness Of Breath and Itching  . Peach [Prunus Persica] Shortness Of Breath and Itching  . Sulfa Antibiotics Shortness Of Breath and Rash  . Amoxicillin   . Stadol [Butorphanol]    Noted had lisinopril in her past for her BP and had a cough so stopped and changed to the [place] product  FH - Dad died of an MI noted, M with HD   O - NAD, masked, obese   BP (!) 165/103 (BP Location: Right Arm, Patient Position: Sitting, Cuff Size:  Large)   Pulse 66   Temp 97.9 F (36.6 C) (Oral)   Resp 16   Ht 5' 6.5" (1.689 m)   Wt 254 lb (115.2 kg)   LMP 05/02/2015 (Exact Date)   SpO2 97%   BMI 40.38 kg/m    Checked her machine (old) and was very high with machine in office and BP cuff with machine not fit around UE as too small (she noted she often uses on her forearm to check and informed not as accurate).   HEENT - sclera anicteric, PERRL Neck - carotids 2+ and = with no bruits Car - RRR without m/g/r Pulm - CTA Abd - obese, Ext - no LE edema Neuro - Affect not flat, approp with conversation   A/P -1.  HTN - concern not as well controlled, not sure of accuracy of outside readings noted cuff size and machine brought with her today that has been using, but have been high and high here on last couple visits and having HA's as well  Continue current BP medications to manage, discussed increase or addition, and felt best to add losartan - 50mg  daily and educated on medicine  Discussed goals for good control of BP  Importance of healthy diet and regular aerobic exercise and weight control noted She will look into a new  machine to check at home with importance of cuff size noted to her today as well, and if can get a few checks at home or on the outside prior to f/u, would be helpful, as long as are accurate. Educated to try to not use email to communicate these readings in the future as well and encouraged signing up for [facility] today   2. Obesity - encouraged weight loss and noted how can often affect BP as well.   3. HA - likely related to higher BP's and monitor as BP better controlled over time  4. Recent facial rash - better presently  F/u in 4 weeks, sooner prn

## 2019-07-02 NOTE — Telephone Encounter (Signed)
Fax from pharm states losartan are back ordered and needs changed new rx for valsartan 80 once a day called to pharm

## 2019-08-05 ENCOUNTER — Encounter: Payer: Self-pay | Admitting: Internal Medicine

## 2019-08-05 ENCOUNTER — Ambulatory Visit: Payer: 59 | Admitting: Internal Medicine

## 2019-08-05 ENCOUNTER — Other Ambulatory Visit: Payer: Self-pay

## 2019-08-05 VITALS — BP 163/95 | HR 64 | Temp 97.0°F | Resp 16 | Ht 67.0 in | Wt 251.0 lb

## 2019-08-05 DIAGNOSIS — G4709 Other insomnia: Secondary | ICD-10-CM

## 2019-08-05 DIAGNOSIS — Z6841 Body Mass Index (BMI) 40.0 and over, adult: Secondary | ICD-10-CM

## 2019-08-05 DIAGNOSIS — R5383 Other fatigue: Secondary | ICD-10-CM | POA: Insufficient documentation

## 2019-08-05 DIAGNOSIS — Z8639 Personal history of other endocrine, nutritional and metabolic disease: Secondary | ICD-10-CM | POA: Insufficient documentation

## 2019-08-05 DIAGNOSIS — R5382 Chronic fatigue, unspecified: Secondary | ICD-10-CM

## 2019-08-05 DIAGNOSIS — I10 Essential (primary) hypertension: Secondary | ICD-10-CM

## 2019-08-05 DIAGNOSIS — R519 Headache, unspecified: Secondary | ICD-10-CM

## 2019-08-05 NOTE — Progress Notes (Signed)
S - Patient with a h/oHTNwho follows-up for BPcheck-up. Was higher on a recent visits noted and last saw 07/02/2019 with losartan 50mg  daily added to regimen last visit but she did not start that. (Had some problems getting that med from pharmacy and so was changed to valsartan and she did not take the ARB). She is only the ziac for her BP. She stated she researches the medicines she takes and that the med precribed had a lot of recalls and concerns and so she decided not to start taking Her BP machine at home was brought last visit and not felt too reliable due to cuff size and age of machine possibly an issue. She has not pursued another and not check her BP at home  Denies any CP, SOB, notes always fatigued still and has trouble staying asleep more than falling asleep, was tried on meds for this in past and she did notlike the way she felt the next day after taking (noted Xanax and trazodone in the chart that were tried),no change in HA's noted prior and she thinks are sinus, often in the center of her forehead and she takes claritin or zyrtec pretty regularly to hlep, no marked vision changes, no LE swelling.  She has not had recent labs done, with the chart pulled an the last doen in March 2019 - not anemic, thyroid tests ok, sugars and kidney function ok, TG's high at 234, liver tests ok, lytes ok   No tob use  No reg exercise  her and her husband tried to start with diet modifications to help lose weight, and has some days not as good.  Allergies  Allergen Reactions  . Ampicillin Shortness Of Breath and Rash  . Apple Shortness Of Breath and Itching  . Cherry Shortness Of Breath and Itching  . Peach [Prunus Persica] Shortness Of Breath and Itching  . Sulfa Antibiotics Shortness Of Breath and Rash  . Amoxicillin   . Stadol [Butorphanol]    Current Outpatient Medications on File Prior to Visit  Medication Sig Dispense Refill  . bisoprolol-hydrochlorothiazide (ZIAC) 5-6.25 MG  tablet TAKE ONE TABLET BY MOUTH IN THE MORNING 90 tablet 3  . cetirizine (ZYRTEC) 10 MG tablet Take 10 mg by mouth every morning.    . cyclobenzaprine (FLEXERIL) 5 MG tablet Take 1 tablet (5 mg total) by mouth 3 (three) times daily as needed for muscle spasms. 30 tablet 0  . estradiol (ESTRACE) 2 MG tablet Take 2 mg by mouth daily.    Marland Kitchen losartan (COZAAR) 50 MG tablet Take 1 tablet (50 mg total) by mouth daily. 90 tablet 3  . naproxen (NAPROSYN) 500 MG tablet Take by mouth.    . valsartan (DIOVAN) 80 MG tablet Take 1 tablet (80 mg total) by mouth daily. 90 tablet 3   No current facility-administered medications on file prior to visit.    Not taking an ARB (valsartan nor losartan). Noted had lisinopril in her past for her BP and had a cough so stopped and changed to the [place] product  FH - Dad died of an MI noted, M with HD, and she noted this when I discussed importance of getting her BP better controlled.  O - NAD, masked, obese  BP (!) 163/95 (BP Location: Right Arm, Patient Position: Sitting, Cuff Size: Large)   Pulse 64   Temp (!) 97 F (36.1 C) (Oral)   Resp 16   Ht 5\' 7"  (1.702 m)   Wt 251 lb (113.9 kg)  LMP 05/02/2015 (Exact Date)   SpO2 97%   BMI 39.31 kg/m   Recheck by me in office - 168/106 on right with large cuff by machine  BP  - 165/103 last visit in office Wt 254 lb (115.2 kg),  BMI 40.38 kg/m last visit  HEENT - sclera anicteric,  Neck - carotids 2+ and = with no bruits Car - RRR without m/g/r Pulm - CTA Abd - obese, Back -  No CVA tenderenss Ext - no LE edema Neuro - Affect not flat, approp with conversation  A/P -1.  HTN - concern BP still not well controlled as needed and noted that last visit and an ARB added to the regimen, but she chose not to take due to concerns with the medication  Noted to her today, best to let us know if decides not to take a medication and explained the voluntary recall of the ARB in November was due to impurity concerns  and do feel this med and class of meds is relatively safe and best to start. I asked if she would do so, and if not, we can increase the Ziac or add to the diuretic component of this, or possibly add another class of med like norvasc (as increasing the beta-blocker may worsen fatigue feeling a concern).  Continue current BP medications to manage (Ziac) and  she agreed to start the ARB medication and emphasized the risk/benefit ratio we always entertain with meds and feel important to get her BP better controlled and she agreed.  Discussed goals for good control of BP  Importance of healthy diet and weight control noted, and encouraged to keep trying as doing with better diet and weight loss as do feel will help manage her BP as well. Hoping that she may pursue another BP machine to have at home again to help follow BP's   2. Obesity - encouraged weight loss as above noted and how can often help with BP as well.   3. HA - no increase and she feels often more sinus based, continue non-sedating antihistamines presently  4. Fatigue - may be due to sleeping issues, more sleep maintenance rather than falling asleep.   Feel best to check labs again as it has been over a year, and would to recheck kidney fcn after on ARB as well. Will order annual labs (fasting) to get in about 4-5 weeks after on the ARB over that time, and f/u for annual PE a few days after and f/u on BP then as well Also to consider sleep apnea component, and await labs as first check (and ensure thyroid still ok, not anemic, or other potential concerns), add a ferritin to lab check also  5. H/o increased TG's, cholesterol checks were ok on last checks in March 2019 and await f/u checks ordered

## 2019-08-05 NOTE — Progress Notes (Signed)
States not taking last medication prescribed for bp.  Said she looked it up & it had to0 many recalls.  AMD

## 2019-08-19 ENCOUNTER — Other Ambulatory Visit: Payer: Self-pay

## 2019-08-20 MED ORDER — ESTRADIOL 2 MG PO TABS: 2.0000 mg | ORAL_TABLET | Freq: Every day | ORAL | 3 refills | Status: DC

## 2019-08-20 NOTE — Telephone Encounter (Signed)
Spoke with pt and she states she hasnt scheduled her pap yet told only needed to do it every 3 years, last pap was around the time of her hysterectomy and that was 2016. Strongly advised she needs to have a PAP and follow up with GYN.   She states that she takes the estradiol to keep her from going into menopause to early since her hysterectomy.

## 2019-08-27 ENCOUNTER — Ambulatory Visit: Payer: 59

## 2019-08-27 ENCOUNTER — Other Ambulatory Visit: Payer: Self-pay

## 2019-08-27 DIAGNOSIS — Z01818 Encounter for other preprocedural examination: Secondary | ICD-10-CM

## 2019-08-27 NOTE — Addendum Note (Signed)
Addended by: Aliene Altes on: 08/27/2019 08:28 AM   Modules accepted: Orders

## 2019-08-28 LAB — CMP12+LP+TP+TSH+6AC+CBC/D/PLT
ALT: 30 IU/L (ref 0–32)
AST: 52 IU/L — ABNORMAL HIGH (ref 0–40)
Albumin/Globulin Ratio: 1.4 (ref 1.2–2.2)
Albumin: 3.9 g/dL (ref 3.8–4.8)
Alkaline Phosphatase: 64 IU/L (ref 39–117)
BUN/Creatinine Ratio: 17 (ref 9–23)
BUN: 15 mg/dL (ref 6–24)
Basophils Absolute: 0 10*3/uL (ref 0.0–0.2)
Basos: 1 %
Bilirubin Total: 0.3 mg/dL (ref 0.0–1.2)
Calcium: 9 mg/dL (ref 8.7–10.2)
Chloride: 101 mmol/L (ref 96–106)
Chol/HDL Ratio: 4.5 ratio — ABNORMAL HIGH (ref 0.0–4.4)
Cholesterol, Total: 184 mg/dL (ref 100–199)
Creatinine, Ser: 0.87 mg/dL (ref 0.57–1.00)
EOS (ABSOLUTE): 0.1 10*3/uL (ref 0.0–0.4)
Eos: 1 %
Estimated CHD Risk: 1.1 times avg. — ABNORMAL HIGH (ref 0.0–1.0)
Free Thyroxine Index: 1.7 (ref 1.2–4.9)
GFR calc Af Amer: 92 mL/min/{1.73_m2} (ref >59)
GFR calc non Af Amer: 80 mL/min/{1.73_m2} (ref >59)
GGT: 50 IU/L (ref 0–60)
Globulin, Total: 2.8 g/dL (ref 1.5–4.5)
Glucose: 99 mg/dL (ref 65–99)
HDL: 41 mg/dL (ref >39)
Hematocrit: 39.8 % (ref 34.0–46.6)
Hemoglobin: 13.7 g/dL (ref 11.1–15.9)
Immature Grans (Abs): 0 10*3/uL (ref 0.0–0.1)
Immature Granulocytes: 0 %
Iron: 105 ug/dL (ref 27–159)
LDH: 188 IU/L (ref 119–226)
LDL Chol Calc (NIH): 87 mg/dL (ref 0–99)
Lymphocytes Absolute: 2.3 10*3/uL (ref 0.7–3.1)
Lymphs: 32 %
MCH: 29.3 pg (ref 26.6–33.0)
MCHC: 34.4 g/dL (ref 31.5–35.7)
MCV: 85 fL (ref 79–97)
Monocytes Absolute: 0.5 10*3/uL (ref 0.1–0.9)
Monocytes: 6 %
Neutrophils Absolute: 4.4 10*3/uL (ref 1.4–7.0)
Neutrophils: 60 %
Phosphorus: 3.8 mg/dL (ref 3.0–4.3)
Platelets: 260 10*3/uL (ref 150–450)
Potassium: 4.1 mmol/L (ref 3.5–5.2)
RBC: 4.67 x10E6/uL (ref 3.77–5.28)
RDW: 12.9 % (ref 11.7–15.4)
Sodium: 141 mmol/L (ref 134–144)
T3 Uptake Ratio: 19 % — ABNORMAL LOW (ref 24–39)
T4, Total: 9 ug/dL (ref 4.5–12.0)
TSH: 2.41 u[IU]/mL (ref 0.450–4.500)
Total Protein: 6.7 g/dL (ref 6.0–8.5)
Triglycerides: 339 mg/dL — ABNORMAL HIGH (ref 0–149)
Uric Acid: 7.1 mg/dL (ref 2.5–7.1)
VLDL Cholesterol Cal: 56 mg/dL — ABNORMAL HIGH (ref 5–40)
WBC: 7.4 10*3/uL (ref 3.4–10.8)

## 2019-08-28 LAB — FERRITIN: Ferritin: 285 ng/mL — ABNORMAL HIGH (ref 15–150)

## 2019-09-03 ENCOUNTER — Encounter: Payer: Self-pay | Admitting: Internal Medicine

## 2019-09-03 ENCOUNTER — Ambulatory Visit: Payer: 59 | Admitting: Internal Medicine

## 2019-09-03 ENCOUNTER — Other Ambulatory Visit: Payer: Self-pay

## 2019-09-03 VITALS — BP 140/89 | HR 71 | Temp 97.6°F | Resp 12 | Ht 66.0 in | Wt 247.0 lb

## 2019-09-03 DIAGNOSIS — J309 Allergic rhinitis, unspecified: Secondary | ICD-10-CM | POA: Insufficient documentation

## 2019-09-03 DIAGNOSIS — Z1239 Encounter for other screening for malignant neoplasm of breast: Secondary | ICD-10-CM

## 2019-09-03 DIAGNOSIS — E7849 Other hyperlipidemia: Secondary | ICD-10-CM | POA: Insufficient documentation

## 2019-09-03 DIAGNOSIS — I10 Essential (primary) hypertension: Secondary | ICD-10-CM

## 2019-09-03 DIAGNOSIS — R5383 Other fatigue: Secondary | ICD-10-CM

## 2019-09-03 NOTE — Patient Instructions (Signed)
Schedule a follow-up with gynecologist recommended

## 2019-09-03 NOTE — Progress Notes (Signed)
S - Patient with a h/oHTNwho follows-up for BPcheck-up and annual review. Was higher on recent visits noted and last saw 08/05/2019 with an ARB added to her regimen that visit (was added visit prior but she did not start that and agreed to do so on August visit). She is also on the ziac for her BP.  Her BP machine at home was brought on a prior visit and not felt too reliable due to cuff size and age of machine possibly an issue. She has not pursued another and not checking her BP at home.  Denies any CP, palps, SOB,notes alwaysa little fatigued still but may be a little better, very stressed over weekend and that is now resolved and feeling better,no abdominal pains, no black or dark stools, HA's better, still with sinus type HA's with allergies and not like flonase products, alternates zyrtec and claritin often to help,no markedvision changes, no LE swelling, no fevers or Covid concerning sx's, no flank pains, has not seen gynecology recent past and overdue for f/u appointment by about a year she noted (had a hysterectomy and put on estrace after), not have mammogram in past year, last noted in 2018 by patient and noted she is overdue for that as well,  No tob use  No reg exercise still Her and her husband tried to start with diet modifications to help lose weight, and has some days not as good.  Allergies  Allergen Reactions  . Ampicillin Shortness Of Breath and Rash  . Apple Shortness Of Breath and Itching  . Cherry Shortness Of Breath and Itching  . Peach [Prunus Persica] Shortness Of Breath and Itching  . Sulfa Antibiotics Shortness Of Breath and Rash  . Amoxicillin   . Stadol [Butorphanol]     Current Outpatient Medications on File Prior to Visit  Medication Sig Dispense Refill  . cetirizine (ZYRTEC) 10 MG tablet Take 10 mg by mouth every morning.    Marland Kitchen estradiol (ESTRACE) 2 MG tablet Take 1 tablet (2 mg total) by mouth daily. 90 tablet 3  . losartan (COZAAR) 50 MG tablet  Take 1 tablet (50 mg total) by mouth daily. 90 tablet 3  . naproxen (NAPROSYN) 500 MG tablet Take by mouth.    . valsartan (DIOVAN) 80 MG tablet Take 1 tablet (80 mg total) by mouth daily. 90 tablet 3  . bisoprolol-hydrochlorothiazide (ZIAC) 5-6.25 MG tablet TAKE ONE TABLET BY MOUTH IN THE MORNING 90 tablet 3  . cyclobenzaprine (FLEXERIL) 5 MG tablet Take 1 tablet (5 mg total) by mouth 3 (three) times daily as needed for muscle spasms. 30 tablet 0   No current facility-administered medications on file prior to visit.     Not taking 2 ARB's (valsartan/losartan). She was confused as what she was taking when reviewed above medicines. Rec'ed bring meds with her to her visits to help. Noted had lisinopril in her past for her BP and had a cough so stopped and changed to the [place] product  FH -Dad died of an MI noted, M with HD, and she noted this when I discussed importance of getting her BP better controlled. + breast CA on mom's side noted  O - NAD,masked, obese  BP 140/89 (BP Location: Left Arm, Patient Position: Sitting, Cuff Size: Large)   Pulse 71   Temp 97.6 F (36.4 C) (Oral)   Resp 12   Ht 5\' 6"  (1.676 m)   Wt 247 lb (112 kg)   LMP 05/02/2015 (Exact Date)   SpO2  98%   BMI 39.87 kg/m   BP  - 165/95 last visit in office Wt 251 lb  last visit  HEENT - sclera anicteric, PERRL, EOMI without nystagmus, no focal sinus tenderness, TM's and canals clear Neck - carotids 2+ and = with no bruits, no adenopathy, no TM Car - RRR without m/g/r Pulm - CTA Abd - obese,NT, ND, obese, no masses, no obvious HSM, BS + Back -  No CVA tenderenss Skin - no lesions of concern on exposed areas and denied otherwise Ext - no LE edema Neuro - Affect not flat, approp with conversation DTR's 1+ and = patella Grossly nonfocal, good sensation to LT in ext's and good strength in ext's Romberg neg Good F to N, good RAMs  Labs reviewed - glu - 99, kidney fcn normal, AST -52, other LFT's normal, TG -  329, HDL-41, LDL - 87, ferritin - 285 and CBC wnl (not anemic),   A/P -1.HTN - BP better controlled presently   Continue current BP medications to manage (Ziac and valsartan) Discussed goals for good control of BP  Importance of healthy diet and weight control noted, and encouraged to keep trying as doing with better diet and weight loss as do feel will help manage her BP as well. Hoping that she may pursue another BP machine to have at home again to help follow BP's   2. Obesity - encouraged weight loss as above noted and how can often help with BP as well.   3. HA/allergic rhinitis - no increase and she feels often more sinus based, continue non-sedating antihistamines presently and di d advocate the flonase products to help as well  4. Fatigue - labs all ok (not anemic, ferritin not low, thyroid ok, glucose ok)   Sedentary lifestyle may be contributing also noted and trying to get more active rec'ed Continue to monitor presently, had refused sleep study in past noted in April 2019 and may need to revisit this pending her response to above and trying to get more active presently  5.  Hyperlipidemia - increased TG's, likely diet related and she agreed  Importance of diet modifications emphasized, an she is doing better recent past Importance of some regular aerobic exercise encouraged Above to help with weight control/weight loss also very important  Will recheck labs in 6 months rather than waiting a year felt best  6. Screening for breast CA  Mammogram overdue and ordered Also rec'ed f/u with gynecologist as overdue for that as well

## 2019-09-05 ENCOUNTER — Other Ambulatory Visit: Payer: Self-pay | Admitting: Internal Medicine

## 2019-09-05 ENCOUNTER — Ambulatory Visit
Admission: RE | Admit: 2019-09-05 | Discharge: 2019-09-05 | Disposition: A | Discharge status: Home / Self Care | Payer: 59 | Source: Ambulatory Visit | Attending: Internal Medicine | Admitting: Internal Medicine

## 2019-09-05 ENCOUNTER — Ambulatory Visit: Payer: 59 | Admitting: Internal Medicine

## 2019-09-05 ENCOUNTER — Other Ambulatory Visit: Payer: Self-pay

## 2019-09-05 DIAGNOSIS — M79672 Pain in left foot: Secondary | ICD-10-CM | POA: Diagnosis not present

## 2019-09-05 DIAGNOSIS — R52 Pain, unspecified: Secondary | ICD-10-CM | POA: Diagnosis not present

## 2019-09-05 NOTE — Progress Notes (Signed)
Patient seen for acute left foot pain when Epic down and documentation on paper chart to be scanned into Epic. Had just seen on Tuesday, with her foot pain starting later that evening.  X-ray ordered to r/o fracture, put on crutches, NWB at present as await x-ray result (concern at proximal 5th MT).

## 2019-09-19 ENCOUNTER — Telehealth: Payer: Self-pay | Admitting: Internal Medicine

## 2019-09-19 ENCOUNTER — Encounter: Payer: Self-pay | Admitting: Internal Medicine

## 2019-09-19 ENCOUNTER — Other Ambulatory Visit: Payer: Self-pay

## 2019-09-19 ENCOUNTER — Ambulatory Visit: Payer: Self-pay | Admitting: Internal Medicine

## 2019-09-19 VITALS — BP 142/110 | HR 80 | Temp 97.8°F | Resp 12 | Ht 67.0 in | Wt 249.0 lb

## 2019-09-19 DIAGNOSIS — F411 Generalized anxiety disorder: Secondary | ICD-10-CM

## 2019-09-19 DIAGNOSIS — I10 Essential (primary) hypertension: Secondary | ICD-10-CM

## 2019-09-19 DIAGNOSIS — M79621 Pain in right upper arm: Secondary | ICD-10-CM

## 2019-09-19 NOTE — Telephone Encounter (Signed)
Pt states that she started having pain and a lump under her armpit yesterday. She is also having pain at the top of her breat that goes down her rt arm. She describes the pain as a dull ache that radiates down her arm. She is currently not taking anything.

## 2019-09-19 NOTE — Telephone Encounter (Signed)
Coming in to be seen today.  Denies redness or change in temperature.

## 2019-09-19 NOTE — Progress Notes (Signed)
S -patient is a 48 year old white female who presents with increasing pain in her right armpit area. She noted yesterday she felt some soreness at the top of her right breast on the outside and under her arm, and last night when she showered she noticed the area was swollen.  She noted more of a dull achy feeling in her arm today, no numbness or tingling extending down the arm and no weakness.  No hand weakness and not dropping things.  The discomfort in her armpit area and above her right breast on the lateral side persists.  She has not felt lumps in her past on any breast exams, and her last mammogram was about 2 years ago and she is overdue.  She has no history of breast nodules or breast cancer concerns.  She noted she feels a little more fatigued in the last couple days.  The pain did not keep her up overnight.  He has not tried anything to relieve the pain and has not taken any medicines to help.   She was just seen September 19 for an office visit to follow-up for her blood pressure and for an annual review.  Her blood pressure on that visit was 140/89.  She remains on the medicines for her blood pressure.  She is very anxious about these symptoms here recently.  At that annual visit, it was noted her mammogram was overdue and one was ordered.  It was also recommended at that visit for her to follow-up with her gynecologist as she was overdue in that round, and she has not yet done so.   Allergies  Allergen Reactions  . Ampicillin Shortness Of Breath and Rash  . Apple Shortness Of Breath and Itching  . Cherry Shortness Of Breath and Itching  . Peach [Prunus Persica] Shortness Of Breath and Itching  . Sulfa Antibiotics Shortness Of Breath and Rash  . Amoxicillin   . Stadol [Butorphanol]    Current Outpatient Medications on File Prior to Visit  Medication Sig Dispense Refill  . bisoprolol-hydrochlorothiazide (ZIAC) 5-6.25 MG tablet TAKE ONE TABLET BY MOUTH IN THE MORNING 90 tablet 3  .  cetirizine (ZYRTEC) 10 MG tablet Take 10 mg by mouth every morning.    Marland Kitchen estradiol (ESTRACE) 2 MG tablet Take 1 tablet (2 mg total) by mouth daily. 90 tablet 3  . naproxen (NAPROSYN) 500 MG tablet Take by mouth.    . valsartan (DIOVAN) 80 MG tablet Take 1 tablet (80 mg total) by mouth daily. 90 tablet 3  . losartan (COZAAR) 50 MG tablet Take 1 tablet (50 mg total) by mouth daily. 90 tablet 3   No current facility-administered medications on file prior to visit.    O  - NAD, masked, anxious appearing  BP (!) 142/110 (BP Location: Left Arm, Patient Position: Sitting, Cuff Size: Large)   Pulse 80   Temp 97.8 F (36.6 C) (Oral)   Resp 12   Ht 5\' 7"  (1.702 m)   Wt 249 lb (112.9 kg)   LMP 05/02/2015 (Exact Date)   SpO2 97%   BMI 39.00 kg/m   HEENT- sclera were anicteric Cardiac-regular rate and rhythm, no murmur Pulmonary- clear to auscultation Chest- with an the nurse in the room as a chaperone, her right breast was examined with no lumps identified.  No nipple discharge.  She was tender with palpation in the upper outer quadrant very superior, more above the breast tissue proper and extended slightly towards the axilla.  There was  no overlying skin changes with no rash.  She was less tender in the axilla region proper, with no enlarged lymph nodes palpated.  She was nontender in the anterior joint line of the shoulder, nontender in the subacromial bursa region in the [facility] joint, and nontender over the clavicle.  She noted she felt more of a pulling than a pain with arm movements including forward elevation and abduction. Ext -the right upper extremity had good range of motion, good strength with testing, including handgrip.  No enlarged blood vessels evident. Neuro -her affect was not flat, appropriate with conversation, anxious Sensation was intact to light touch in the entire upper extremity, distal pulses were good.  Strength was good.   Ass/Plan:1. Right axillary pain/swelling - no  breast lumps or adenopathy noted on exam, no skin lesions present, do feel is likely soft tissue based and she does have larger breasts which can be a source to this. Much less likely a thrombosis concern (like a Hospital doctor) and did note this is in the differential. May need further imaging/testing pending clinical response to below.  Keep appt for mammogram as planned Ice to area several times daily prn (rec'ed a bag of peas or corn from the freezer as an option to use) for 10-15 minutes as tolerated Generic naproxyn - twice daily with food prn (not use high dose or regularly as noted concerns chronic NSAID use can increase BP). Relative rest with limited use of UE short term    2. HTN - do feel is higher today due to pain and anxiety with sx's and she agreed  Cont BP meds Will f/u early next week and need f/u checks as these sx's resolve to make sure BP is controlled with the meds taking presently  3. Anxiety - related to current sx's and she noted she felt better after this assessment today  F/u early next week, Mon rec'ed and emphasized if pain increasing and swelling increasing, other concerning sx's developing, needs to f/u in ER and she was understanding of this rec.  I also noted to her that I will not be the one following up with her next week, as I will not be available to see patients in this clinic after this week.

## 2019-09-19 NOTE — Progress Notes (Signed)
Yesterday felt sore at top of right breast & under arm.  Last night when showered noticed the area was swollen.  Today the right arm feels like a dull ache - starts at top of right arm & extends down the arm to the hand.  Last mammogram was 07/18/17.  Has a mammogram scheduled for the 21st of this month (09/2019).   S/P Hysterectomy.  Past two days has been extremely tired.  Last two nights in bed & asleep by 8:00 am.  AMD

## 2019-09-19 NOTE — Telephone Encounter (Signed)
Was told by the breast care center that she would have to have a new order to move her appt. up with the concerns. Pt also states that they told her it should include a possible ultra sound.

## 2019-09-19 NOTE — Telephone Encounter (Signed)
After seeing her and the exam today, it is ok to keep the scheduled mammogram and there is not a present need to add an ultrasound.   She should keep the f/u next Monday as rec'ed and be seen again here by a colleague to help confirm the above is appropriate (or if needs changed based on that f/u assessment).

## 2019-09-20 NOTE — Telephone Encounter (Signed)
Spoke with Felicia Barker. Informed her of Dr. Leonarda Salon response.  Appt is scheduled for Monday (09/23/19) at 1:15 pm.  Felicia Barker she's still sore in the right underarm/breast area & swelling is the same (no increase).  Said she's taking Aleve bid & using an ice pack intermittently.  Advised if worsens over the weekend, she needs to go to Urgent Care of ED.  Verbalized understanding.  AMD

## 2019-09-23 ENCOUNTER — Other Ambulatory Visit: Payer: Self-pay | Admitting: Physician Assistant

## 2019-09-23 ENCOUNTER — Ambulatory Visit: Payer: Self-pay | Admitting: Occupational Medicine

## 2019-09-23 ENCOUNTER — Other Ambulatory Visit: Payer: Self-pay

## 2019-09-23 VITALS — BP 139/89 | HR 64 | Temp 97.5°F | Resp 16 | Ht 66.0 in | Wt 250.0 lb

## 2019-09-23 DIAGNOSIS — N644 Mastodynia: Secondary | ICD-10-CM

## 2019-09-23 NOTE — Patient Instructions (Signed)
Thank you for choosing [facility].  You were seen today for: 1. Breast pain, right  - MM Digital Diagnostic Bilat; Future - US BREAST COMPLETE UNI LEFT INC AXILLA; Future - US BREAST COMPLETE UNI RIGHT INC AXILLA; Future  You have been scheduled for a mammogram and bilateral breast ultrasound. Call Joice @ [facility] to schedule. Address: [facility], [place], [place], [place] [address] Hours:  Open. Closes 5PM Phone: [phone]  We will talk to you about the results and future treatment plans.  In the meantime, recommend applying ice and/or heat, 15-20 minutes at a time, several times a day. May continue to use over the counter Aleve for pain/soreness. Take with food to prevent stomach upset.  Hope you feel better soon!  Breast Tenderness Breast tenderness is a common problem for women of all ages. Breast tenderness may cause mild discomfort to severe pain. The pain usually comes and goes in association with your menstrual cycle, but it can be constant. Breast tenderness has many possible causes, including hormone changes and some medicines. Your health care provider may order tests, such as a mammogram or an ultrasound, to check for any unusual findings. Having breast tenderness usually does not mean that you have breast cancer. Follow these instructions at home: Sometimes, reassurance that you do not have breast cancer is all that is needed. In general, follow these home care instructions: Managing pain and discomfort   If directed, apply ice to the area: ? Put ice in a plastic bag. ? Place a towel between your skin and the bag. ? Leave the ice on for 20 minutes, 2-3 times a day.  Make sure you are wearing a supportive bra, especially during exercise. You may also want to wear a supportive bra while sleeping if your breasts are very tender. Medicines   Take over-the-counter and prescription medicines only as told by your health care provider. If the cause of your pain is infection, you may be prescribed an antibiotic medicine.  If you were prescribed an antibiotic, take it as told by your health care provider. Do not stop taking the antibiotic even if you start to feel better. General instructions   Your health care provider may recommend that you reduce the amount of fat in your diet. You can do this by: ? Limiting fried foods. ? Cooking foods using methods, such as baking, boiling, grilling, and broiling.  Decrease the amount of caffeine in your diet. You can do this by drinking more water and choosing caffeine-free options.  Keep a log of the days and times when your breasts are most tender.  Ask your health care provider how to do breast exams at home. This will help you notice if you have an unusual growth or lump. Contact a health care provider if:  Any part of your breast is hard, red, and hot to the touch. This may be a sign of infection.  You are not breastfeeding and you have fluid, especially blood or pus, coming out of your nipples.  You have a fever.  You have a new or painful lump in your breast that remains after your menstrual period ends.  Your pain does not improve or it gets worse.  Your pain is interfering with your daily activities. This information is not intended to replace advice given to you by your health care provider. Make sure you discuss any  questions you have with your health care provider. Document Released: 11/17/2008 Document Revised: 11/17/2017 Document Reviewed: 09/02/2016 Elsevier Patient Education  2020 [employer].

## 2019-09-23 NOTE — Progress Notes (Signed)
Patient ID: [name] DOB: 14-Jul-1971 AGE: 48 y.o. MRN: [id number]   PCP: Towanda Malkin, MD   Chief Complaint:  Chief Complaint  Patient presents with  . Follow-up    Swelling Rt Breast/Arm pit area  . Covid Screening    Negative     Subjective:    HPI:  Felicia Barker is a 48 y.o. female presents for evaluation  Chief Complaint  Patient presents with  . Follow-up    Swelling Rt Breast/Arm pit area  . Covid Screening    Negative   48 year old female presents to [facility] with five day history of right breast discomfort.  Patient returns to clinic for re-evaluation of pain in right axillary area. Seen on 09/19/2019. Had two day history of soreness at the top of her right breast on the outside and under her arm. Associated edema. Last mammogram 07/18/2017; this year's mammogram scheduled for 10/09/2019. Was advised ice and OTC Aleve.  Patient presents today with worsening symptoms. States right breast feels swollen; especially on superior aspect. Associated soreness in right axillary area. Associated dull ache in right upper arm, medial aspect. No improvement with OTC Aleve. States associated fatigue has resolved. Denies fever, chills, body aches, headache, nausea/vomiting, nipple rash/discharge, chest pain, palpitations, SOB. Patient does report several month history of right nipple pruritis. No personal history of right breast mass/cancer. Has previously been told she had dense breast tissue. Patient's maternal aunt with history of breast cancer, x2, underwent mastectomy. Patient's maternal grandmother with history of breast cancer, young at diagnosis, still alive.  Annual bloodwork performed 08/27/2019; elevated triglycerides (339). Ferritin 285.   A limited review of symptoms was performed, pertinent positives and negatives as mentioned in HPI.  The following portions of the patient's history were reviewed and  updated as appropriate: allergies, current medications and past medical history.  Patient Active Problem List   Diagnosis Date Noted  . Axillary pain, right 09/19/2019  . Anxiety state 09/19/2019  . Left foot pain 09/05/2019  . Breast cancer screening 09/03/2019  . Other hyperlipidemia 09/03/2019  . Allergic rhinitis 09/03/2019  . Fatigue 08/05/2019  . Other insomnia 08/05/2019  . History of endogenous hypertriglyceridemia 08/05/2019  . Essential hypertension 07/02/2019  . Class 2 severe obesity due to excess calories with serious comorbidity and body mass index (BMI) of 39.0 to 39.9 in adult ([place]) 07/02/2019  . Nonintractable episodic headache 07/02/2019  . Chronic pelvic pain in female 02/10/2015  . S/P endometrial ablation 02/10/2015  . Hematologic disorder 02/10/2015    Allergies  Allergen Reactions  . Ampicillin Shortness Of Breath and Rash  . Apple Shortness Of Breath and Itching  . Cherry Shortness Of Breath and Itching  . Peach [Prunus Persica] Shortness Of Breath and Itching  . Sulfa Antibiotics Shortness Of Breath and Rash  . Amoxicillin   . Stadol [Butorphanol]     Current Outpatient Medications on File Prior to Visit  Medication Sig Dispense Refill  . bisoprolol-hydrochlorothiazide (ZIAC) 5-6.25 MG tablet TAKE ONE TABLET BY MOUTH IN THE MORNING 90 tablet 3  . cetirizine (ZYRTEC) 10 MG tablet Take 10 mg by mouth every morning.    Marland Kitchen estradiol (ESTRACE) 2 MG tablet Take 1 tablet (2 mg total) by mouth daily. 90 tablet 3  . naproxen (NAPROSYN) 500 MG tablet Take by mouth.    . valsartan (DIOVAN) 80 MG tablet Take 1 tablet (80 mg total) by mouth daily. [place]  tablet 3   No current facility-administered medications on file prior to visit.        Objective:   Vitals:   09/23/19 1326  BP: 139/89  Pulse: 64  Resp: 16  Temp: (!) 97.5 F (36.4 C)  SpO2: 99%     Wt Readings from Last 3 Encounters:  09/23/19 250 lb (113.4 kg)  09/19/19 249 lb (112.9 kg)  09/03/19  247 lb (112 kg)    Physical Exam:   General Appearance:  Patient sitting comfortably on examination table. Conversational. Kermit Balo self-historian. In no acute distress. Afebrile.   Head:  Normocephalic, without obvious abnormality, atraumatic  Eyes:  PERRL, conjunctiva/corneas clear, EOM's intact  Neck: Supple, symmetrical, trachea midline, no adenopathy  Lungs:   Clear to auscultation bilaterally, respirations unlabored. Good aeration. No rales, rhonchi, crackles or wheezing.  Heart:  Regular rate and rhythm, S1 and S2 normal, no murmur, rub, or gallop  Extremities: Extremities normal, atraumatic, no cyanosis or edema  Pulses: 2+ and symmetric  Skin: Skin color, texture, turgor normal, no rashes or lesions  Bilateral breast inspection. Normal. Appear equal in size. No erythema, edema, or ecchymosis. No gross deformity. No pulling/contracture. Nipples appear normal. No inversion. No rash. No discharge/drainage. Moderate tenderness with palpation along superior aspect of right breast and in right axillary area, along tail of spence and in axillary area. No palpable mass. No palpable fluctuance. No palpable warmth. Full ROM of right shoulder. 5/5 right grip strength. Strong radial pulse. Sensation intact. No pain with movement of neck.  Lymph nodes: Cervical, supraclavicular, and axillary nodes normal  Neurologic: Normal    Assessment & Plan:    Exam findings, diagnosis etiology and medication use and indications reviewed with patient. Follow-Up and discharge instructions provided. No emergent/urgent issues found on exam.  Patient education was provided.   Patient verbalized understanding of information provided and agrees with plan of care (POC), all questions answered. The patient is advised to call or return to clinic if condition does not see an improvement in symptoms, or to seek the care of the closest emergency department if condition worsens with the below plan.    1. Breast pain,  right  - MM Digital Diagnostic Bilat; Future - US BREAST COMPLETE UNI LEFT INC AXILLA; Future - US BREAST COMPLETE UNI RIGHT INC AXILLA; Future  48 year old female presents with five day history of right breast pain. Tender to palpation at superior aspect and right axillary area. No skin changes or palpable mass. Family history of breast cancer; maternal aunt and maternal grandmother. Patient anxious about possibility of breast cancer. Primarily for peace of mind, ordered mammogram and ultrasound for further evaluation.  If no symptom improvement, may need to discuss antibiotic (for possible lymphadenopathy), stronger anti-inflammatory (such as Medrol Dose Pak), or with associated RUE pain - orthopedist referral for further evaluation.  Patient agrees with plan.   Darlin Priestly, MHS, PA-C Montey Hora, MHS, PA-C Advanced Practice Provider [facility]  [address] [place], [place] [address] (p): [phone] [email or web address]

## 2019-09-23 NOTE — Progress Notes (Signed)
States feels like Rt breast feels swollen some, other than that feels about the same as day evaluated by Dr. Roxan Hockey.

## 2019-09-24 ENCOUNTER — Ambulatory Visit: Payer: 59 | Admitting: Internal Medicine

## 2019-09-24 ENCOUNTER — Telehealth: Payer: Self-pay

## 2019-09-24 NOTE — Telephone Encounter (Signed)
Felicia Barker's earliest appointment for a mammogram and ultrasound is next Monday at 9:20am.  Asked if there is an alternate location where she could be sent.

## 2019-09-25 ENCOUNTER — Ambulatory Visit: Payer: Self-pay | Admitting: Internal Medicine

## 2019-09-30 ENCOUNTER — Ambulatory Visit
Admission: RE | Admit: 2019-09-30 | Discharge: 2019-09-30 | Disposition: A | Discharge status: Home / Self Care | Payer: 59 | Source: Ambulatory Visit | Attending: Physician Assistant | Admitting: Physician Assistant

## 2019-09-30 ENCOUNTER — Ambulatory Visit: Payer: Self-pay | Admitting: Occupational Medicine

## 2019-09-30 ENCOUNTER — Other Ambulatory Visit: Payer: Self-pay

## 2019-09-30 ENCOUNTER — Encounter: Payer: Self-pay | Admitting: Occupational Medicine

## 2019-09-30 VITALS — BP 142/93 | HR 69 | Temp 97.5°F | Resp 12 | Ht 66.0 in | Wt 246.0 lb

## 2019-09-30 DIAGNOSIS — N644 Mastodynia: Secondary | ICD-10-CM

## 2019-09-30 DIAGNOSIS — R928 Other abnormal and inconclusive findings on diagnostic imaging of breast: Secondary | ICD-10-CM | POA: Diagnosis not present

## 2019-09-30 NOTE — Progress Notes (Signed)
  Subjective:     Patient ID: Felicia Barker, female   DOB: 26-Jul-1971, 49 y.o.   MRN: [id number]  HPI Employee had her mammogram today.  Report below.  She is still having symptoms of pain in her right axillary region and right breast.  I recommended that she follow-up with her OB/GYN for evaluation of this problem.  Review of Systems     Objective:   Physical Exam No skin redness or obvious skin infection.  Tender in right axilla and right upper lateral quadrant of breast.   Study Result  CLINICAL DATA:  Patient complains of diffuse right breast pain radiating from her axilla throughout the entire breast.  EXAM: DIGITAL DIAGNOSTIC BILATERAL MAMMOGRAM WITH CAD AND TOMO  COMPARISON:  Previous exam(s).  ACR Breast Density Category b: There are scattered areas of fibroglandular density.  FINDINGS: No suspicious mass, malignant type microcalcifications or distortion detected in either breast.  Mammographic images were processed with CAD.  IMPRESSION: No evidence of malignancy in either breast.  RECOMMENDATION: Bilateral screening mammogram in 1 year is recommended.  I have discussed the findings and recommendations with the patient. If applicable, a reminder letter will be sent to the patient regarding the next appointment.  BI-RADS CATEGORY  1: Negative.   Electronically Signed   By: Lillia Mountain M.D.   On: 09/30/2019 09:33    Assessment:     Right breast pain I    Plan:     Follow up with OB/GYN for this problem.

## 2019-09-30 NOTE — Progress Notes (Signed)
States swelling is about the same in the right breast/underarm area.  States mammogram results were good.  AMD

## 2019-10-16 ENCOUNTER — Other Ambulatory Visit: Payer: Self-pay

## 2019-10-16 ENCOUNTER — Encounter: Payer: Self-pay | Admitting: Obstetrics and Gynecology

## 2019-10-16 ENCOUNTER — Ambulatory Visit (INDEPENDENT_AMBULATORY_CARE_PROVIDER_SITE_OTHER): Payer: 59 | Admitting: Obstetrics and Gynecology

## 2019-10-16 VITALS — BP 146/84 | HR 70 | Ht 66.0 in | Wt 249.0 lb

## 2019-10-16 DIAGNOSIS — N644 Mastodynia: Secondary | ICD-10-CM

## 2019-10-16 MED ORDER — IBUPROFEN 600 MG PO TABS: 600.0000 mg | ORAL_TABLET | Freq: Four times a day (QID) | ORAL | 3 refills | Status: DC | PRN

## 2019-10-16 NOTE — Patient Instructions (Signed)

## 2019-10-16 NOTE — Progress Notes (Signed)
Obstetrics & Gynecology Office Visit   Chief Complaint:  Chief Complaint  Patient presents with  . Breast Pain    Pain in R breast and some swelling under arm pit x1 month     History of Present Illness: 48 y.o. [id number] Status post prior hysterectomy in 2016 presenting with 1 month of right breast pain.  The pain is located on the inner upper and lower quadrant of the right breast.  There is tradition in the mid upper and outer quadrant of the right breast. No overlying skin changes.  No nipple discharge.  She denies any particular strenuous activity preceding onset of symptoms or trauma to the area.  Pain is worsened with range of motion in the right arm.   She has undergone a dagnostic mammogram 09/30/2019 BI-RAD I.  No concerning personal or family history of breast cancer.  Review of Systems: Review of Systems  Constitutional: Negative.   Cardiovascular: Positive for chest pain.  Skin: Negative.    Past Medical History:  Past Medical History:  Diagnosis Date  . Abnormal cervical cytology   . Anxiety    with loss of Dad  . Endometriosis   . Hip pain, chronic, left   . Hypertension   . Insomnia   . Kidney function abnormal    with left ovary /ablatiion surgery  . Low back pain   . Seasonal allergies     Past Surgical History:  Past Surgical History:  Procedure Laterality Date  . ABDOMINAL HYSTERECTOMY    . ABLATION  oct 2014  . CHOLECYSTECTOMY    . CYSTOSCOPY N/A 05/12/2015   Procedure: CYSTOSCOPY;  Surgeon: Malachy Mood, MD;  Location: [facility] ORS;  Service: Gynecology;  Laterality: N/A;  . DIAGNOSTIC LAPAROSCOPY  left ovary  . DILATION AND CURETTAGE OF UTERUS     leep  . LAPAROSCOPIC HYSTERECTOMY N/A 05/12/2015   Procedure: HYSTERECTOMY TOTAL LAPAROSCOPIC;  Surgeon: Malachy Mood, MD;  Location: [facility] ORS;  Service: Gynecology;  Laterality: N/A;  . LAPAROSCOPIC SALPINGO OOPHERECTOMY Right 05/12/2015   Procedure: LAPAROSCOPIC SALPINGO OOPHORECTOMY;  Surgeon:  Malachy Mood, MD;  Location: [facility] ORS;  Service: Gynecology;  Laterality: Right;    Gynecologic History: Patient's last menstrual period was 05/02/2015 (exact date).  Obstetric History: [id number]  Family History:  Family History  Problem Relation Age of Onset  . Breast cancer Maternal Aunt 60       2nd onset at age 72  . Breast cancer Maternal Grandmother 79  . Breast cancer Cousin 68  . Heart disease Mother   . COPD Mother   . Heart attack Father   . Parkinson's disease Other   . Dementia Other     Social History:  Social History   Socioeconomic History  . Marital status: Married    Spouse name: Not on file  . Number of children: Not on file  . Years of education: Not on file  . Highest education level: Not on file  Occupational History  . Not on file  Social Needs  . Financial resource strain: Not on file  . Food insecurity    Worry: Not on file    Inability: Not on file  . Transportation needs    Medical: Not on file    Non-medical: Not on file  Tobacco Use  . Smoking status: Never Smoker  . Smokeless tobacco: Never Used  Substance and Sexual Activity  . Alcohol use: No  . Drug use: No  . Sexual activity: Yes  Birth control/protection: None  Lifestyle  . Physical activity    Days per week: Not on file    Minutes per session: Not on file  . Stress: Not on file  Relationships  . Social Herbalist on phone: Not on file    Gets together: Not on file    Attends religious service: Not on file    Active member of club or organization: Not on file    Attends meetings of clubs or organizations: Not on file    Relationship status: Not on file  . Intimate partner violence    Fear of current or ex partner: Not on file    Emotionally abused: Not on file    Physically abused: Not on file    Forced sexual activity: Not on file  Other Topics Concern  . Not on file  Social History Narrative  . Not on file    Allergies:  Allergies  Allergen  Reactions  . Ampicillin Shortness Of Breath and Rash  . Apple Shortness Of Breath and Itching  . Cherry Shortness Of Breath and Itching  . Peach [Prunus Persica] Shortness Of Breath and Itching  . Sulfa Antibiotics Shortness Of Breath and Rash  . Amoxicillin   . Stadol [Butorphanol]     Medications: Prior to Admission medications   Medication Sig Start Date End Date Taking? Authorizing Provider  bisoprolol-hydrochlorothiazide [facility]) 5-6.25 MG tablet TAKE ONE TABLET BY MOUTH IN THE MORNING 12/30/15  Yes Burnette, Clearnce Sorrel, PA-C  cetirizine (ZYRTEC) 10 MG tablet Take 10 mg by mouth every morning.   Yes [provider]  estradiol (ESTRACE) 2 MG tablet Take 1 tablet (2 mg total) by mouth daily. 08/20/19  Yes Towanda Malkin, MD  valsartan (DIOVAN) 80 MG tablet Take 1 tablet (80 mg total) by mouth daily. 07/02/19 10/16/19 Yes Towanda Malkin, MD  naproxen (NAPROSYN) 500 MG tablet Take by mouth.    [provider]    Physical Exam Vitals: Blood pressure (!) 146/84, pulse 70, height 5\' 6"  (1.676 m), weight 249 lb (112.9 kg), last menstrual period 05/02/2015.  General: NAD, well nourished, appears stated age 48: normocephalic, anicteric Pulmonary: No increased work of breathing Cardiovascular: RRR, distal pulses 2+ Breast symmetrical, no tenderness, no palpable nodules or masses, no skin or nipple retraction present, no nipple discharge.  No axillary or supraclavicular lymphadenopathy. Extremities: no edema, erythema, or tenderness Neurologic: Grossly intact Psychiatric: mood appropriate, affect full  Female chaperone present for pelvic  portions of the physical exam  Assessment: 48 y.o. [id number] with right mastalgia  Plan: Problem List Items Addressed This Visit    None     1) Mastalgia  - discussed reassurance of normal breast exam and diagnostic BI-RAD I mammogram - suspect a MSK etiology involving the pectoralis major given distribution and  worse with elevation of the right arm - trial of NSAID - loosing fitting but supportive bra - topical lineament creams ok  2) A total of 15 minutes were spent in face-to-face contact with the patient during this encounter with over half of that time devoted to counseling and coordination of care.  3) Return in about 4 weeks (around 11/13/2019) for clinical breast exam.   Malachy Mood, MD, [facility], [place] 10/16/2019, 8:10 AM

## 2019-10-17 LAB — ESTRADIOL: Estradiol: 261 pg/mL

## 2019-10-17 LAB — FOLLICLE STIMULATING HORMONE: FSH: 4.2 m[IU]/mL

## 2019-10-25 ENCOUNTER — Other Ambulatory Visit: Payer: Self-pay | Admitting: Physician Assistant

## 2019-10-25 DIAGNOSIS — I10 Essential (primary) hypertension: Secondary | ICD-10-CM

## 2019-10-25 MED ORDER — VALSARTAN 80 MG PO TABS: 80.0000 mg | ORAL_TABLET | Freq: Every day | ORAL | 0 refills | Status: DC

## 2019-10-25 NOTE — Progress Notes (Signed)
Dr Roxan Hockey comments started patient on additional BP  Rx Valsartan 80 mg at most recent visit-patient started taking in August . Refill request today will OK for 30 days and get OV for BP f/u check In next month

## 2019-11-26 ENCOUNTER — Ambulatory Visit (INDEPENDENT_AMBULATORY_CARE_PROVIDER_SITE_OTHER): Payer: 59 | Admitting: Obstetrics and Gynecology

## 2019-11-26 ENCOUNTER — Other Ambulatory Visit: Payer: Self-pay

## 2019-11-26 ENCOUNTER — Encounter: Payer: Self-pay | Admitting: Obstetrics and Gynecology

## 2019-11-26 ENCOUNTER — Ambulatory Visit: Payer: Self-pay

## 2019-11-26 VITALS — BP 132/86 | HR 60 | Ht 66.0 in | Wt 248.0 lb

## 2019-11-26 DIAGNOSIS — N644 Mastodynia: Secondary | ICD-10-CM | POA: Diagnosis not present

## 2019-11-26 NOTE — Progress Notes (Signed)
Obstetrics & Gynecology Office Visit   Chief Complaint:  Chief Complaint  Patient presents with   Breast Pain    Pain under right arm pit and breast x few months    History of Present Illness: 48 y.o. [id number] presenting for follow up breast exam.  She reports that she has noted improvement in her right breast tenderness since her last visit.  She has not noted any new skin changes or lumps.  She does feel like she may have had trauma to the area. Her husband teaches self defense and was demonstrating a self defense technique and she may have pulled a muscle during at that time.   Review of Systems: Review of Systems  Constitutional: Negative.   Cardiovascular: Positive for chest pain.  Skin: Negative.      Past Medical History:  Past Medical History:  Diagnosis Date   Abnormal cervical cytology    Anxiety    with loss of Dad   Endometriosis    Hip pain, chronic, left    Hypertension    Insomnia    Kidney function abnormal    with left ovary /ablatiion surgery   Low back pain    Seasonal allergies     Past Surgical History:  Past Surgical History:  Procedure Laterality Date   ABDOMINAL HYSTERECTOMY     ABLATION  oct 2014   CHOLECYSTECTOMY     CYSTOSCOPY N/A 05/12/2015   Procedure: CYSTOSCOPY;  Surgeon: Malachy Mood, MD;  Location: [facility];  Service: Gynecology;  Laterality: N/A;   DIAGNOSTIC LAPAROSCOPY  left ovary   DILATION AND CURETTAGE OF UTERUS     leep   LAPAROSCOPIC HYSTERECTOMY N/A 05/12/2015   Procedure: HYSTERECTOMY TOTAL LAPAROSCOPIC;  Surgeon: Malachy Mood, MD;  Location: [facility];  Service: Gynecology;  Laterality: N/A;   LAPAROSCOPIC SALPINGO OOPHERECTOMY Right 05/12/2015   Procedure: LAPAROSCOPIC SALPINGO OOPHORECTOMY;  Surgeon: Malachy Mood, MD;  Location: [facility];  Service: Gynecology;  Laterality: Right;    Gynecologic History: Patient's last menstrual period was 05/02/2015 (exact date).  Obstetric History:  [id number]  Family History:  Family History  Problem Relation Age of Onset   Breast cancer Maternal Aunt 60       2nd onset at age 79   Breast cancer Maternal Grandmother 33   Breast cancer Cousin 1   Heart disease Mother    COPD Mother    Heart attack Father    Parkinson's disease Other    Dementia Other     Social History:  Social History   Socioeconomic History   Marital status: Married    Spouse name: Not on file   Number of children: Not on file   Years of education: Not on file   Highest education level: Not on file  Occupational History   Not on file  Social Needs   Financial resource strain: Not on file   Food insecurity    Worry: Not on file    Inability: Not on file   Transportation needs    Medical: Not on file    Non-medical: Not on file  Tobacco Use   Smoking status: Never Smoker   Smokeless tobacco: Never Used  Substance and Sexual Activity   Alcohol use: No   Drug use: No   Sexual activity: Yes    Birth control/protection: None  Lifestyle   Physical activity    Days per week: Not on file    Minutes per session: Not on file  Stress: Not on file  Relationships   Social connections    Talks on phone: Not on file    Gets together: Not on file    Attends religious service: Not on file    Active member of club or organization: Not on file    Attends meetings of clubs or organizations: Not on file    Relationship status: Not on file   Intimate partner violence    Fear of current or ex partner: Not on file    Emotionally abused: Not on file    Physically abused: Not on file    Forced sexual activity: Not on file  Other Topics Concern   Not on file  Social History Narrative   Not on file    Allergies:  Allergies  Allergen Reactions   Ampicillin Shortness Of Breath and Rash   Apple Shortness Of Breath and Itching   Cherry Shortness Of Breath and Itching   Peach [Prunus Persica] Shortness Of Breath and Itching     Sulfa Antibiotics Shortness Of Breath and Rash   Amoxicillin    Stadol [Butorphanol]     Medications: Prior to Admission medications   Medication Sig Start Date End Date Taking? Authorizing Provider  bisoprolol-hydrochlorothiazide [facility]) 5-6.25 MG tablet TAKE ONE TABLET BY MOUTH IN THE MORNING 12/30/15  Yes Burnette, Clearnce Sorrel, PA-C  cetirizine (ZYRTEC) 10 MG tablet Take 10 mg by mouth every morning.   Yes [provider]  estradiol (ESTRACE) 2 MG tablet Take 1 tablet (2 mg total) by mouth daily. 08/20/19  Yes Towanda Malkin, MD  ibuprofen (ADVIL) 600 MG tablet Take 1 tablet (600 mg total) by mouth every 6 (six) hours as needed. 10/16/19  Yes Malachy Mood, MD  naproxen (NAPROSYN) 500 MG tablet Take by mouth.   Yes [provider]  valsartan (DIOVAN) 80 MG tablet Take 1 tablet (80 mg total) by mouth daily. 10/25/19 11/24/19  Jan Fireman, PA-C    Physical Exam Vitals:  Vitals:   11/26/19 0806  BP: 132/86  Pulse: 60   Patient's last menstrual period was 05/02/2015 (exact date).  General: NAD HEENT: normocephalic, anicteric Pulmonary: No increased work of breathing Breast symmetrical, no tenderness, no palpable nodules or masses, no skin or nipple retraction present, no nipple discharge.  No axillary or supraclavicular lymphadenopathy. Extremities: no edema, erythema, or tenderness Neurologic: Grossly intact Psychiatric: mood appropriate, affect full  Female chaperone present for pelvic  portions of the physical exam  Assessment: 48 y.o. [id number] with right breast mastalgia  Plan: Problem List Items Addressed This Visit    None     1) Mastalgia - suspect muscular component and improving.  Monitor for continued improvement.  If worsens discussed next step would be diagnostic ultrasound  2) Return in about 1 year (around 11/25/2020) for annual.   Malachy Mood, MD, [place], [facility] 11/26/2019, 8:17 AM

## 2019-12-04 ENCOUNTER — Other Ambulatory Visit: Payer: Self-pay

## 2019-12-04 DIAGNOSIS — I10 Essential (primary) hypertension: Secondary | ICD-10-CM

## 2019-12-07 MED ORDER — VALSARTAN 80 MG PO TABS: 80.0000 mg | ORAL_TABLET | Freq: Every day | ORAL | 2 refills | Status: DC

## 2019-12-11 ENCOUNTER — Ambulatory Visit: Payer: Self-pay | Admitting: Physician Assistant

## 2019-12-11 ENCOUNTER — Other Ambulatory Visit: Payer: Self-pay | Admitting: Physician Assistant

## 2019-12-11 ENCOUNTER — Other Ambulatory Visit: Payer: Self-pay

## 2019-12-11 DIAGNOSIS — I10 Essential (primary) hypertension: Secondary | ICD-10-CM

## 2019-12-11 MED ORDER — VALSARTAN 80 MG PO TABS: 80.0000 mg | ORAL_TABLET | Freq: Every day | ORAL | 3 refills | Status: DC

## 2019-12-22 ENCOUNTER — Other Ambulatory Visit: Payer: Self-pay | Admitting: Obstetrics and Gynecology

## 2020-02-18 ENCOUNTER — Other Ambulatory Visit: Payer: Self-pay

## 2020-02-18 DIAGNOSIS — I10 Essential (primary) hypertension: Secondary | ICD-10-CM

## 2020-02-18 DIAGNOSIS — R5382 Chronic fatigue, unspecified: Secondary | ICD-10-CM

## 2020-02-18 NOTE — Progress Notes (Unsigned)
Patient comes in today for 6 months labs requested by Dr.Hendrickson.

## 2020-02-19 LAB — CMP12+LP+TP+TSH+6AC+CBC/D/PLT
ALT: 28 IU/L (ref 0–32)
AST: 52 IU/L — ABNORMAL HIGH (ref 0–40)
Albumin/Globulin Ratio: 1.3 (ref 1.2–2.2)
Albumin: 3.7 g/dL — ABNORMAL LOW (ref 3.8–4.8)
Alkaline Phosphatase: 68 IU/L (ref 39–117)
BUN/Creatinine Ratio: 13 (ref 9–23)
BUN: 14 mg/dL (ref 6–24)
Basophils Absolute: 0 10*3/uL (ref 0.0–0.2)
Basos: 0 %
Bilirubin Total: 0.2 mg/dL (ref 0.0–1.2)
Calcium: 8.7 mg/dL (ref 8.7–10.2)
Chloride: 103 mmol/L (ref 96–106)
Chol/HDL Ratio: 4.4 ratio (ref 0.0–4.4)
Cholesterol, Total: 171 mg/dL (ref 100–199)
Creatinine, Ser: 1.11 mg/dL — ABNORMAL HIGH (ref 0.57–1.00)
EOS (ABSOLUTE): 0.1 10*3/uL (ref 0.0–0.4)
Eos: 2 %
Estimated CHD Risk: 1 times avg. (ref 0.0–1.0)
Free Thyroxine Index: 1.4 (ref 1.2–4.9)
GFR calc Af Amer: 69 mL/min/{1.73_m2} (ref >59)
GFR calc non Af Amer: 60 mL/min/{1.73_m2} (ref >59)
GGT: 56 IU/L (ref 0–60)
Globulin, Total: 2.8 g/dL (ref 1.5–4.5)
Glucose: 99 mg/dL (ref 65–99)
HDL: 39 mg/dL — ABNORMAL LOW (ref >39)
Hematocrit: 39.7 % (ref 34.0–46.6)
Hemoglobin: 13.7 g/dL (ref 11.1–15.9)
Immature Grans (Abs): 0 10*3/uL (ref 0.0–0.1)
Immature Granulocytes: 0 %
Iron: 79 ug/dL (ref 27–159)
LDH: 187 IU/L (ref 119–226)
LDL Chol Calc (NIH): 85 mg/dL (ref 0–99)
Lymphocytes Absolute: 2.3 10*3/uL (ref 0.7–3.1)
Lymphs: 27 %
MCH: 29.8 pg (ref 26.6–33.0)
MCHC: 34.5 g/dL (ref 31.5–35.7)
MCV: 86 fL (ref 79–97)
Monocytes Absolute: 0.6 10*3/uL (ref 0.1–0.9)
Monocytes: 7 %
Neutrophils Absolute: 5.2 10*3/uL (ref 1.4–7.0)
Neutrophils: 64 %
Phosphorus: 3.9 mg/dL (ref 3.0–4.3)
Platelets: 256 10*3/uL (ref 150–450)
Potassium: 4.3 mmol/L (ref 3.5–5.2)
RBC: 4.6 x10E6/uL (ref 3.77–5.28)
RDW: 13.1 % (ref 11.7–15.4)
Sodium: 139 mmol/L (ref 134–144)
T3 Uptake Ratio: 16 % — ABNORMAL LOW (ref 24–39)
T4, Total: 8.8 ug/dL (ref 4.5–12.0)
TSH: 2.92 u[IU]/mL (ref 0.450–4.500)
Total Protein: 6.5 g/dL (ref 6.0–8.5)
Triglycerides: 285 mg/dL — ABNORMAL HIGH (ref 0–149)
Uric Acid: 6 mg/dL (ref 2.6–6.2)
VLDL Cholesterol Cal: 47 mg/dL — ABNORMAL HIGH (ref 5–40)
WBC: 8.2 10*3/uL (ref 3.4–10.8)

## 2020-02-19 LAB — FERRITIN: Ferritin: 240 ng/mL — ABNORMAL HIGH (ref 15–150)

## 2020-03-04 ENCOUNTER — Encounter: Payer: Self-pay | Admitting: Registered Nurse

## 2020-03-04 ENCOUNTER — Ambulatory Visit: Payer: Self-pay | Admitting: Registered Nurse

## 2020-03-04 ENCOUNTER — Other Ambulatory Visit: Payer: Self-pay

## 2020-03-04 VITALS — BP 134/90 | HR 72 | Temp 98.4°F | Resp 12 | Ht 66.0 in | Wt 252.0 lb

## 2020-03-04 DIAGNOSIS — E785 Hyperlipidemia, unspecified: Secondary | ICD-10-CM

## 2020-03-04 DIAGNOSIS — R7401 Elevation of levels of liver transaminase levels: Secondary | ICD-10-CM | POA: Insufficient documentation

## 2020-03-04 DIAGNOSIS — R7989 Other specified abnormal findings of blood chemistry: Secondary | ICD-10-CM

## 2020-03-04 DIAGNOSIS — J301 Allergic rhinitis due to pollen: Secondary | ICD-10-CM

## 2020-03-04 DIAGNOSIS — I10 Essential (primary) hypertension: Secondary | ICD-10-CM

## 2020-03-04 MED ORDER — BISOPROLOL-HYDROCHLOROTHIAZIDE 5-6.25 MG PO TABS: 1.0000 | ORAL_TABLET | Freq: Every morning | ORAL | 3 refills | Status: DC

## 2020-03-04 MED ORDER — VALSARTAN 80 MG PO TABS: 80.0000 mg | ORAL_TABLET | Freq: Every day | ORAL | 3 refills | Status: AC

## 2020-03-04 NOTE — Progress Notes (Signed)
Subjective:    Patient ID: Felicia Barker, female    DOB: 1971/11/09, 49 y.o.   MRN: [id number]  49y/o caucasian female married established here for lab results/annual physical.  Concerned about her abnormal tests needs refills on her blood pressure medications.  Has gained wait due to decreased activity during covid and sitting still holding new grandbaby at home.  Patient likes [place] food with the cheese sauce, unfrosted strawberry poptart for breakfast, honey bunches of oats cereal, typically meat smoked her husband preference with meals.  Tried to cut back on carbs-pasta and rice.  She reports her portions typically small on meat.  Works in [employer] for [employer] [place] PMHx hypertension, low back pain, left hip pain, RUQ pain resolved, endometriosis, headaches-tension related, insomnia/fatigue, overweight and left knee pain  PSHx left oophorectomy, leep procedure, hysterectomy 2016 Rx bisprolol/hydrochlorothiazide 5/6.25mg po qam since 2012, estradiol 33m 2017 xanax 0.545mpo BID 2016, 2018 apr #10 RF0; trazodone 5035mo qhs 04/2017 phentermine 37.5mg66mr/may 2019 didn't help with appetite or help her to lose weight; hydrochlorothiazide 25mg82mly 07/2006; lisinoril 10mg 53my 2011; etodolac 500mg p19mD prn back pain, ambien 5mg po 110m   appt with Dr HendrickRoxan Hockey020 BP 130/88 HR 80 height 5.6 wt 249lbs     Review of Systems  Constitutional: Positive for activity change. Negative for appetite change, chills, diaphoresis, fatigue, fever and unexpected weight change.  HENT: Negative for trouble swallowing and voice change.   Eyes: Negative for photophobia and visual disturbance.  Respiratory: Negative for cough, shortness of breath, wheezing and stridor.   Cardiovascular: Negative for palpitations and leg swelling.  Gastrointestinal: Negative for diarrhea, nausea and vomiting.  Endocrine: Negative for cold intolerance and heat intolerance.   Genitourinary: Negative for difficulty urinating.  Musculoskeletal: Negative for gait problem, neck pain and neck stiffness.  Allergic/Immunologic: Positive for environmental allergies and food allergies.  Neurological: Negative for dizziness, tremors, seizures, syncope, facial asymmetry, speech difficulty, weakness, light-headedness and numbness.  Hematological: Negative for adenopathy. Does not bruise/bleed easily.  Psychiatric/Behavioral: Negative for agitation, confusion and sleep disturbance.       Objective:   Physical Exam Constitutional:      General: She is awake. She is not in acute distress.    Appearance: Normal appearance. She is well-developed and well-groomed. She is morbidly obese. She is not ill-appearing, toxic-appearing or diaphoretic.  HENT:     Head: Normocephalic and atraumatic.     Jaw: There is normal jaw occlusion.     Salivary Glands: Right salivary gland is not diffusely enlarged or tender. Left salivary gland is not diffusely enlarged or tender.     Right Ear: Hearing and external ear normal.     Left Ear: Hearing and external ear normal.     Nose: Nose normal.     Right Sinus: No maxillary sinus tenderness or frontal sinus tenderness.     Left Sinus: No maxillary sinus tenderness or frontal sinus tenderness.     Mouth/Throat:     Lips: Pink. No lesions.     Mouth: Mucous membranes are moist. No injury, lacerations, oral lesions or angioedema.     Dentition: No gingival swelling or gum lesions.     Tongue: No lesions. Tongue does not deviate from midline.     Palate: No mass and lesions.     Pharynx: Uvula midline. Pharyngeal swelling and posterior oropharyngeal erythema present. No oropharyngeal exudate or uvula swelling.  Tonsils: No tonsillar exudate. 0 on the right. 0 on the left.  Eyes:     General: Lids are normal. Gaze aligned appropriately. Allergic shiner present. No visual field deficit or scleral icterus.       Right eye: No discharge.         Left eye: No discharge.     Extraocular Movements: Extraocular movements intact.     Conjunctiva/sclera: Conjunctivae normal.     Pupils: Pupils are equal, round, and reactive to light.  Neck:     Thyroid: No thyromegaly.     Trachea: Trachea and phonation normal.  Cardiovascular:     Rate and Rhythm: Normal rate and regular rhythm.     Pulses: Normal pulses.          Radial pulses are 2+ on the right side and 2+ on the left side.     Heart sounds: Normal heart sounds, S1 normal and S2 normal. Heart sounds not distant. No murmur. No friction rub. No gallop.   Pulmonary:     Effort: Pulmonary effort is normal. No respiratory distress.     Breath sounds: Normal breath sounds and air entry. No stridor, decreased air movement or transmitted upper airway sounds. No decreased breath sounds, wheezing, rhonchi or rales.     Comments: Spoke full sentences without difficulty; no cough observed in exam room; wearing cloth mask due to covid 19 pandemic Abdominal:     General: Abdomen is flat. Bowel sounds are normal. There is no distension.     Palpations: Abdomen is soft. There is no shifting dullness, fluid wave, hepatomegaly, splenomegaly, mass or pulsatile mass.     Tenderness: There is no right CVA tenderness, left CVA tenderness, guarding or rebound. Negative signs include Murphy's sign.     Hernia: No hernia is present. There is no hernia in the umbilical area or ventral area.     Comments: Dull to percussion x 4 quads; normoactive bowel sounds x 4 quads  Musculoskeletal:        General: No signs of injury. Normal range of motion.     Right shoulder: Normal.     Left shoulder: Normal.     Right elbow: Normal.     Left elbow: Normal.     Right hand: Normal.     Left hand: Normal.     Cervical back: Normal, normal range of motion and neck supple. No swelling, edema, deformity, erythema, signs of trauma, rigidity, torticollis or crepitus. No pain with movement. Normal range of motion.      Thoracic back: Normal.     Lumbar back: Normal.     Right hip: Normal.     Left hip: Normal.     Right knee: Normal.     Left knee: Normal.     Right lower leg: No edema.     Left lower leg: No edema.  Lymphadenopathy:     Head:     Right side of head: No submental, submandibular, tonsillar or preauricular adenopathy.     Left side of head: No submental, submandibular, tonsillar or preauricular adenopathy.     Cervical: No cervical adenopathy.     Right cervical: No superficial cervical adenopathy.    Left cervical: No superficial cervical adenopathy.  Skin:    General: Skin is warm and dry.     Capillary Refill: Capillary refill takes less than 2 seconds.     Coloration: Skin is not ashen, cyanotic, jaundiced, mottled, pale or sallow.  Findings: No abrasion, abscess, acne, bruising, burn, ecchymosis, erythema, signs of injury, laceration, lesion, petechiae, rash or wound.     Nails: There is no clubbing.  Neurological:     General: No focal deficit present.     Mental Status: She is alert and oriented to person, place, and time. Mental status is at baseline.     GCS: GCS eye subscore is 4. GCS verbal subscore is 5. GCS motor subscore is 6.     Cranial Nerves: Cranial nerves are intact. No cranial nerve deficit, dysarthria or facial asymmetry.     Sensory: Sensation is intact. No sensory deficit.     Motor: Motor function is intact. No weakness, tremor, atrophy, abnormal muscle tone or seizure activity.     Coordination: Coordination is intact. Coordination normal.     Gait: Gait is intact. Gait normal.     Comments: Gait sure and steady in clinic; on/off exam table and in/out of chair without difficulty; bilateral hand grasp equal 5/5  Psychiatric:        Attention and Perception: Attention and perception normal.        Mood and Affect: Mood and affect normal.        Speech: Speech normal.        Behavior: Behavior normal. Behavior is cooperative.        Thought Content:  Thought content normal.        Cognition and Memory: Cognition and memory normal.        Judgment: Judgment normal.     Reviewed results notes and lab results and compared with paper chart located at [facility] [place] with patient and given printed copy of results and results notes.  Consider repeat ferritin, LFTs, lipids in 6 months.  Patient has stopped NSAIDS and going to start keto or dash diet and increasing activity/steps utilizing iphone/apple watch.  Currently averaging 2700 steps per day over past year.  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  Discussed sources of ferritin/iron and reviewed her diet highest is cereals honey bunches of oats 2/3 cup is 60% of daily recommended allowance.  Not taking multivitamin.  Otherwise her most common foods cheese, no fat milk, round steak.  Reviewed nutritional information for each of these and less than 2% RDA iron.  Patient verbalized understanding information/instructions, agreed with plan of care and had no further questions at this time. Results for Felicia Barker, Felicia Barker" (MRN [id number]) as of 03/04/2020 17:01  Ref. Range 02/18/2020 08:40  Sodium Latest Ref Range: 134 - 144 mmol/L 139  Potassium Latest Ref Range: 3.5 - 5.2 mmol/L 4.3  Chloride Latest Ref Range: 96 - 106 mmol/L 103  Glucose Latest Ref Range: 65 - 99 mg/dL 99  BUN Latest Ref Range: 6 - 24 mg/dL 14  Creatinine Latest Ref Range: 0.57 - 1.00 mg/dL 1.11 (H)  Calcium Latest Ref Range: 8.7 - 10.2 mg/dL 8.7  BUN/Creatinine Ratio Latest Ref Range: 9 - 23  13  Phosphorus Latest Ref Range: 3.0 - 4.3 mg/dL 3.9  Alkaline Phosphatase Latest Ref Range: 39 - 117 IU/L 68  Albumin Latest Ref Range: 3.8 - 4.8 g/dL 3.7 (L)  Albumin/Globulin Ratio Latest Ref Range: 1.2 - 2.2  1.3  Uric Acid Latest Ref Range: 2.6 - 6.2 mg/dL 6.0  AST Latest Ref Range: 0 - 40 IU/L 52 (H)  ALT Latest Ref Range: 0 - 32 IU/L 28  Total Protein Latest Ref Range: 6.0 - 8.5 g/dL 6.5  Total Bilirubin Latest  Ref Range:  0.0 - 1.2 mg/dL 0.2  GGT Latest Ref Range: 0 - 60 IU/L 56  GFR, Est Non African American Latest Ref Range: >59 mL/min/1.73 60  GFR, Est African American Latest Ref Range: >59 mL/min/1.73 69  Estimated CHD Risk Latest Ref Range: 0.0 - 1.0 times avg. 1.0  LDH Latest Ref Range: 119 - 226 IU/L 187  Total CHOL/HDL Ratio Latest Ref Range: 0.0 - 4.4 ratio 4.4  Cholesterol, Total Latest Ref Range: 100 - 199 mg/dL 171  HDL Cholesterol Latest Ref Range: >39 mg/dL 39 (L)  Triglycerides Latest Ref Range: 0 - 149 mg/dL 285 (H)  VLDL Cholesterol Cal Latest Ref Range: 5 - 40 mg/dL 47 (H)  LDL Chol Calc (NIH) Latest Ref Range: 0 - 99 mg/dL 85  Iron Latest Ref Range: 27 - 159 ug/dL 79  Ferritin Latest Ref Range: 15 - 150 ng/mL 240 (H)  Globulin, Total Latest Ref Range: 1.5 - 4.5 g/dL 2.8  WBC Latest Ref Range: 3.4 - 10.8 x10E3/uL 8.2  RBC Latest Ref Range: 3.77 - 5.28 x10E6/uL 4.60  Hemoglobin Latest Ref Range: 11.1 - 15.9 g/dL 13.7  HCT Latest Ref Range: 34.0 - 46.6 % 39.7  MCV Latest Ref Range: 79 - 97 fL 86  MCH Latest Ref Range: 26.6 - 33.0 pg 29.8  MCHC Latest Ref Range: 31.5 - 35.7 g/dL 34.5  RDW Latest Ref Range: 11.7 - 15.4 % 13.1  Platelets Latest Ref Range: 150 - 450 x10E3/uL 256  Neutrophils Latest Ref Range: Not Estab. % 64  Immature Granulocytes Latest Ref Range: Not Estab. % 0  NEUT# Latest Ref Range: 1.4 - 7.0 x10E3/uL 5.2  Lymphocyte # Latest Ref Range: 0.7 - 3.1 x10E3/uL 2.3  Monocytes Absolute Latest Ref Range: 0.1 - 0.9 x10E3/uL 0.6  Basophils Absolute Latest Ref Range: 0.0 - 0.2 x10E3/uL 0.0  Immature Grans (Abs) Latest Ref Range: 0.0 - 0.1 x10E3/uL 0.0  Lymphs Latest Ref Range: Not Estab. % 27  Monocytes Latest Ref Range: Not Estab. % 7  Basos Latest Ref Range: Not Estab. % 0  Eos Latest Ref Range: Not Estab. % 2  EOS (ABSOLUTE) Latest Ref Range: 0.0 - 0.4 x10E3/uL 0.1  TSH Latest Ref Range: 0.450 - 4.500 uIU/mL 2.920  Thyroxine (T4) Latest Ref Range: 4.5 - 12.0 ug/dL 8.8   Free Thyroxine Index Latest Ref Range: 1.2 - 4.9  1.4  T3 Uptake Ratio Latest Ref Range: 24 - 39 % 16 (L)   Per paper chart previous 2011-2019 creatine 0.86-0.91 and albumin 3.6-4.3 AST 16-25 total cholesterol 163-215 trigs 234-381 HDL 38-48 LDL 58-107 T3 uptake 14-27 Ferritin elevated but improved from Sep 2020. My chart message sent to patient. "Felicia Barker, The elevated creatinine (Kidney function) new avoid dehydration and over the counter medications without first consulting with provider/pharmacist. Drink water to keep urine pale yellow and peeing every 3-4 hours when awake. Your other tests have had similar results in the past. The changes in numbers could be due to your chronic medications/health conditions but we will discuss further at your appt.  Sincerely, Gerarda Fraction NP-C"   Olen Cordial, NP  02/19/2020 6:10 PM EST    Please pull chart and place on my shelf to review when onsite. Elevated creatinine, AST, trigs, ferritin and low albumin, HDL and T3 (worsening). I plan to give patient copy of labs and discuss in detail 03/04/2020. I plan to give LFT, elevated ferritin, creatinine, dyslipidemia, high fiber/cholesterol and DASH diet handouts. I recommend exercise  150 minutes per week; dietary fiber daily by mouth 20 grams women per up to date; eat whole grains/fruits/vegetables; keep added sugars to less than 100 calories/ 5 teaspoons for women per American Heart Association; blood sugar, electrolytes, iron, and complete blood count normal        Comments to Patient Seen by patient Zonia Kief Fenlon on 03/04/2020 11:13 AM EDT   Felicia Barker, The elevated creatinine (Kidney function) new avoid dehydration and over the counter medications without first consulting with provider/pharmacist. Drink water to keep urine pale yellow and peeing every 3-4 hours when awake. Your other tests have had similar results in the past. The changes in numbers could be due to your chronic  medications/health conditions but we will discuss further at your appt.  Sincerely, Gerarda Fraction NP-C         Assessment & Plan:  A-essential hypertension, BMI 40.67, elevated AST, elevated ferritin, elevated creatinine, seasonal allergic rhinitis, dyslipidemia  P-Continue current medications as directed. Electronic Rx sent to her pharmacy of choice for ziac 5/6.25mg po daily #90 RF3 and diovan 31m po daily #90 RF3. Start DASH diet and exercise program 150 minutes exercise/activity per week.  Recommended weight loss/weight maintenance to BMI less than 30. Return to the clinic if any new symptoms/notify clinic staff if visual changes, frequent headache, chest pain or dyspnea on mild or  minimal exertion. Exitcare handout printed and given on DASH diet and  managing hypertension.  If no improvement BP with weight loss may need to adjust her medications again.  Patient verbalized agreement and understanding of treatment plan and had no further questions at this time. P2: Diet and Exercise specific for HTN   Discussed lifestyle modification re diet to decrease white starches/sugars e.g. potatoes/bread increase omega 3 sources nuts, fish options add more fruits and vegetables and lean protein choices such as hummus or yogurt. Patient given exitcare handout on high cholesterol and dash diet. Discussed weight loss. Repeat lipids in 6-12 months consider adding statin if no improvement with TLC.  Patient agreed with plan of care and verbalized understanding of information/instructions and had no further questions at this time.   Elevated AST ?fatty liver disease.  I recommended weight loss.  Recently had been taking a lot of NSAIDS also.  Discussed her chronic medications could be contributing also.  Repeat LFTs in 6 months after TLC nonfasting. exitcare handout printed and given on liver function tests. Patient verbalized understanding information/instructions, agreed with plan of care and had no further  questions at this time.  BMI 40 decrease non-nutritional added sugars in diet and increase activity.  Has iphone and fitness watch going to start using again.  Discussed exercise can be in 5 minute spurts throughout the day does not need to be 30 minute intervals to have health benefits.  Increase water intake  Work up to 150 minutes exercise per week and increasing steps 100-200 steps per day total of 1000 steps per week until goal of 137048per day met may take up to one year.  Currently average 2700 steps per day.  Check weight at each office visit.  Patient verbalized understanding information/instructions, agreed with plan of care and had no further questions at this time.  Elevated creatinine may have been due to situational dehydration fasting for labs or due to nsaid use that she has now stopped.  Recheck in 6 months sooner if new urinary symptoms eg dysuria, tea or cola colored urine, back/flank pain, hand/feet swelling.  Patient verbalized understanding  information/instructions, agreed with plan of care and had no further questions at this time  Elevated ferritin improving from previous patient will continue to monitor intake of iron rich foods and repeat lab in 6 months nonfasting.  Patient given exitcare handout on ferritin printed.  Avoid any supplements with iron/ferritin and known intake of cereals/enriched foods above 100% RDA. Discussed with patient excess iron can be deposited in organs and cause further health problems.  Patient verbalized understanding information/instructions, agreed with plan of care and had no further questions at this time.  Patient may use normal saline nasal spray 2 sprays each nostril q2h wa as needed congestion/allergic rhinitis.  Given 1 bottle from clinic stock. Consider starting flonase 81mg 1 spray each nostril BID if no relief with zyrtec/saline and showering at bedtime.  Patient denied personal or family history of ENT cancer.  OTC antihistamine of choice  zyrtec 138mpo daily.  Avoid triggers if possible.  Shower prior to bedtime if exposed to triggers.  If allergic dust/dust mites recommend mattress/pillow covers/encasements; washing linens, vacuuming, sweeping, dusting weekly.  Call or return to clinic as needed if these symptoms worsen or fail to improve as anticipated.  Patient verbalized understanding of instructions, agreed with plan of care and had no further questions at this time.  P2:  Avoidance and hand washing.

## 2020-03-04 NOTE — Progress Notes (Signed)
Presents today to review lab results that were collected on 02/18/2020.  AMD

## 2020-03-04 NOTE — Patient Instructions (Addendum)
DASH Eating Plan DASH stands for "Dietary Approaches to Stop Hypertension." The DASH eating plan is a healthy eating plan that has been shown to reduce high blood pressure (hypertension). It may also reduce your risk for type 2 diabetes, heart disease, and stroke. The DASH eating plan may also help with weight loss. What are tips for following this plan?  General guidelines  Avoid eating more than 2,300 mg (milligrams) of salt (sodium) a day. If you have hypertension, you may need to reduce your sodium intake to 1,500 mg a day.  Limit alcohol intake to no more than 1 drink a day for nonpregnant women and 2 drinks a day for men. One drink equals 12 oz of beer, 5 oz of wine, or 1 oz of hard liquor.  Work with your health care provider to maintain a healthy body weight or to lose weight. Ask what an ideal weight is for you.  Get at least 30 minutes of exercise that causes your heart to beat faster (aerobic exercise) most days of the week. Activities may include walking, swimming, or biking.  Work with your health care provider or diet and nutrition specialist (dietitian) to adjust your eating plan to your individual calorie needs. Reading food labels   Check food labels for the amount of sodium per serving. Choose foods with less than 5 percent of the Daily Value of sodium. Generally, foods with less than 300 mg of sodium per serving fit into this eating plan.  To find whole grains, look for the word "whole" as the first word in the ingredient list. Shopping  Buy products labeled as "low-sodium" or "no salt added."  Buy fresh foods. Avoid canned foods and premade or frozen meals. Cooking  Avoid adding salt when cooking. Use salt-free seasonings or herbs instead of table salt or sea salt. Check with your health care provider or pharmacist before using salt substitutes.  Do not fry foods. Cook foods using healthy methods such as baking, boiling, grilling, and broiling instead.  Cook with  heart-healthy oils, such as olive, canola, soybean, or sunflower oil. Meal planning  Eat a balanced diet that includes: ? 5 or more servings of fruits and vegetables each day. At each meal, try to fill half of your plate with fruits and vegetables. ? Up to 6-8 servings of whole grains each day. ? Less than 6 oz of lean meat, poultry, or fish each day. A 3-oz serving of meat is about the same size as a deck of cards. One egg equals 1 oz. ? 2 servings of low-fat dairy each day. ? A serving of nuts, seeds, or beans 5 times each week. ? Heart-healthy fats. Healthy fats called Omega-3 fatty acids are found in foods such as flaxseeds and coldwater fish, like sardines, salmon, and mackerel.  Limit how much you eat of the following: ? Canned or prepackaged foods. ? Food that is high in trans fat, such as fried foods. ? Food that is high in saturated fat, such as fatty meat. ? Sweets, desserts, sugary drinks, and other foods with added sugar. ? Full-fat dairy products.  Do not salt foods before eating.  Try to eat at least 2 vegetarian meals each week.  Eat more home-cooked food and less restaurant, buffet, and fast food.  When eating at a restaurant, ask that your food be prepared with less salt or no salt, if possible. What foods are recommended? The items listed may not be a complete list. Talk with your dietitian about   what dietary choices are best for you. Grains Whole-grain or whole-wheat bread. Whole-grain or whole-wheat pasta. Brown rice. Oatmeal. Quinoa. Bulgur. Whole-grain and low-sodium cereals. Pita bread. Low-fat, low-sodium crackers. Whole-wheat flour tortillas. Vegetables Fresh or frozen vegetables (raw, steamed, roasted, or grilled). Low-sodium or reduced-sodium tomato and vegetable juice. Low-sodium or reduced-sodium tomato sauce and tomato paste. Low-sodium or reduced-sodium canned vegetables. Fruits All fresh, dried, or frozen fruit. Canned fruit in natural juice (without  added sugar). Meat and other protein foods Skinless chicken or turkey. Ground chicken or turkey. Pork with fat trimmed off. Fish and seafood. Egg whites. Dried beans, peas, or lentils. Unsalted nuts, nut butters, and seeds. Unsalted canned beans. Lean cuts of beef with fat trimmed off. Low-sodium, lean deli meat. Dairy Low-fat (1%) or fat-free (skim) milk. Fat-free, low-fat, or reduced-fat cheeses. Nonfat, low-sodium ricotta or cottage cheese. Low-fat or nonfat yogurt. Low-fat, low-sodium cheese. Fats and oils Soft margarine without trans fats. Vegetable oil. Low-fat, reduced-fat, or light mayonnaise and salad dressings (reduced-sodium). Canola, safflower, olive, soybean, and sunflower oils. Avocado. Seasoning and other foods Herbs. Spices. Seasoning mixes without salt. Unsalted popcorn and pretzels. Fat-free sweets. What foods are not recommended? The items listed may not be a complete list. Talk with your dietitian about what dietary choices are best for you. Grains Baked goods made with fat, such as croissants, muffins, or some breads. Dry pasta or rice meal packs. Vegetables Creamed or fried vegetables. Vegetables in a cheese sauce. Regular canned vegetables (not low-sodium or reduced-sodium). Regular canned tomato sauce and paste (not low-sodium or reduced-sodium). Regular tomato and vegetable juice (not low-sodium or reduced-sodium). Pickles. Olives. Fruits Canned fruit in a light or heavy syrup. Fried fruit. Fruit in cream or butter sauce. Meat and other protein foods Fatty cuts of meat. Ribs. Fried meat. Bacon. Sausage. Bologna and other processed lunch meats. Salami. Fatback. Hotdogs. Bratwurst. Salted nuts and seeds. Canned beans with added salt. Canned or smoked fish. Whole eggs or egg yolks. Chicken or turkey with skin. Dairy Whole or 2% milk, cream, and half-and-half. Whole or full-fat cream cheese. Whole-fat or sweetened yogurt. Full-fat cheese. Nondairy creamers. Whipped toppings.  Processed cheese and cheese spreads. Fats and oils Butter. Stick margarine. Lard. Shortening. Ghee. Bacon fat. Tropical oils, such as coconut, palm kernel, or palm oil. Seasoning and other foods Salted popcorn and pretzels. Onion salt, garlic salt, seasoned salt, table salt, and sea salt. Worcestershire sauce. Tartar sauce. Barbecue sauce. Teriyaki sauce. Soy sauce, including reduced-sodium. Steak sauce. Canned and packaged gravies. Fish sauce. Oyster sauce. Cocktail sauce. Horseradish that you find on the shelf. Ketchup. Mustard. Meat flavorings and tenderizers. Bouillon cubes. Hot sauce and Tabasco sauce. Premade or packaged marinades. Premade or packaged taco seasonings. Relishes. Regular salad dressings. Where to find more information:  National Heart, Lung, and Blood Institute: www.nhlbi.nih.gov  American Heart Association: www.heart.org Summary  The DASH eating plan is a healthy eating plan that has been shown to reduce high blood pressure (hypertension). It may also reduce your risk for type 2 diabetes, heart disease, and stroke.  With the DASH eating plan, you should limit salt (sodium) intake to 2,300 mg a day. If you have hypertension, you may need to reduce your sodium intake to 1,500 mg a day.  When on the DASH eating plan, aim to eat more fresh fruits and vegetables, whole grains, lean proteins, low-fat dairy, and heart-healthy fats.  Work with your health care provider or diet and nutrition specialist (dietitian) to adjust your eating plan to your   individual calorie needs. This information is not intended to replace advice given to you by your health care provider. Make sure you discuss any questions you have with your health care provider. Document Revised: 11/17/2017 Document Reviewed: 11/28/2016 Elsevier Patient Education  2020 [employer] Managing Your Hypertension Hypertension is commonly called high blood pressure. This is when the force of your blood pressing against  the walls of your arteries is too strong. Arteries are blood vessels that carry blood from your heart throughout your body. Hypertension forces the heart to work harder to pump blood, and may cause the arteries to become narrow or stiff. Having untreated or uncontrolled hypertension can cause heart attack, stroke, kidney disease, and other problems. What are blood pressure readings? A blood pressure reading consists of a higher number over a lower number. Ideally, your blood pressure should be below 120/80. The first ("top") number is called the systolic pressure. It is a measure of the pressure in your arteries as your heart beats. The second ("bottom") number is called the diastolic pressure. It is a measure of the pressure in your arteries as the heart relaxes. What does my blood pressure reading mean? Blood pressure is classified into four stages. Based on your blood pressure reading, your health care provider may use the following stages to determine what type of treatment you need, if any. Systolic pressure and diastolic pressure are measured in a unit called mm Hg. Normal  Systolic pressure: below 120.  Diastolic pressure: below 80. Elevated  Systolic pressure: 120-129.  Diastolic pressure: below 80. Hypertension stage 1  Systolic pressure: 130-139.  Diastolic pressure: 80-89. Hypertension stage 2  Systolic pressure: 140 or above.  Diastolic pressure: 90 or above. What health risks are associated with hypertension? Managing your hypertension is an important responsibility. Uncontrolled hypertension can lead to:  A heart attack.  A stroke.  A weakened blood vessel (aneurysm).  Heart failure.  Kidney damage.  Eye damage.  Metabolic syndrome.  Memory and concentration problems. What changes can I make to manage my hypertension? Hypertension can be managed by making lifestyle changes and possibly by taking medicines. Your health care provider will help you make a plan  to bring your blood pressure within a normal range. Eating and drinking   Eat a diet that is high in fiber and potassium, and low in salt (sodium), added sugar, and fat. An example eating plan is called the DASH (Dietary Approaches to Stop Hypertension) diet. To eat this way: ? Eat plenty of fresh fruits and vegetables. Try to fill half of your plate at each meal with fruits and vegetables. ? Eat whole grains, such as whole wheat pasta, brown rice, or whole grain bread. Fill about one quarter of your plate with whole grains. ? Eat low-fat diary products. ? Avoid fatty cuts of meat, processed or cured meats, and poultry with skin. Fill about one quarter of your plate with lean proteins such as fish, chicken without skin, beans, eggs, and tofu. ? Avoid premade and processed foods. These tend to be higher in sodium, added sugar, and fat.  Reduce your daily sodium intake. Most people with hypertension should eat less than 1,500 mg of sodium a day.  Limit alcohol intake to no more than 1 drink a day for nonpregnant women and 2 drinks a day for men. One drink equals 12 oz of beer, 5 oz of wine, or 1 oz of hard liquor. Lifestyle  Work with your health care provider to maintain a healthy   body weight, or to lose weight. Ask what an ideal weight is for you.  Get at least 30 minutes of exercise that causes your heart to beat faster (aerobic exercise) most days of the week. Activities may include walking, swimming, or biking.  Include exercise to strengthen your muscles (resistance exercise), such as weight lifting, as part of your weekly exercise routine. Try to do these types of exercises for 30 minutes at least 3 days a week.  Do not use any products that contain nicotine or tobacco, such as cigarettes and e-cigarettes. If you need help quitting, ask your health care provider.  Control any long-term (chronic) conditions you have, such as high cholesterol or diabetes. Monitoring  Monitor your blood  pressure at home as told by your health care provider. Your personal target blood pressure may vary depending on your medical conditions, your age, and other factors.  Have your blood pressure checked regularly, as often as told by your health care provider. Working with your health care provider  Review all the medicines you take with your health care provider because there may be side effects or interactions.  Talk with your health care provider about your diet, exercise habits, and other lifestyle factors that may be contributing to hypertension.  Visit your health care provider regularly. Your health care provider can help you create and adjust your plan for managing hypertension. Will I need medicine to control my blood pressure? Your health care provider may prescribe medicine if lifestyle changes are not enough to get your blood pressure under control, and if:  Your systolic blood pressure is 130 or higher.  Your diastolic blood pressure is 80 or higher. Take medicines only as told by your health care provider. Follow the directions carefully. Blood pressure medicines must be taken as prescribed. The medicine does not work as well when you skip doses. Skipping doses also puts you at risk for problems. Contact a health care provider if:  You think you are having a reaction to medicines you have taken.  You have repeated (recurrent) headaches.  You feel dizzy.  You have swelling in your ankles.  You have trouble with your vision. Get help right away if:  You develop a severe headache or confusion.  You have unusual weakness or numbness, or you feel faint.  You have severe pain in your chest or abdomen.  You vomit repeatedly.  You have trouble breathing. Summary  Hypertension is when the force of blood pumping through your arteries is too strong. If this condition is not controlled, it may put you at risk for serious complications.  Your personal target blood pressure  may vary depending on your medical conditions, your age, and other factors. For most people, a normal blood pressure is less than 120/80.  Hypertension is managed by lifestyle changes, medicines, or both. Lifestyle changes include weight loss, eating a healthy, low-sodium diet, exercising more, and limiting alcohol. This information is not intended to replace advice given to you by your health care provider. Make sure you discuss any questions you have with your health care provider. Document Revised: 03/29/2019 Document Reviewed: 11/02/2016 Elsevier Patient Education  [place]. High Cholesterol  High cholesterol is a condition in which the blood has high levels of a white, waxy, fat-like substance (cholesterol). The human body needs small amounts of cholesterol. The liver makes all the cholesterol that the body needs. Extra (excess) cholesterol comes from the food that we eat. Cholesterol is carried from the liver by  the blood through the blood vessels. If you have high cholesterol, deposits (plaques) may build up on the walls of your blood vessels (arteries). Plaques make the arteries narrower and stiffer. Cholesterol plaques increase your risk for heart attack and stroke. Work with your health care provider to keep your cholesterol levels in a healthy range. What increases the risk? This condition is more likely to develop in people who:  Eat foods that are high in animal fat (saturated fat) or cholesterol.  Are overweight.  Are not getting enough exercise.  Have a family history of high cholesterol. What are the signs or symptoms? There are no symptoms of this condition. How is this diagnosed? This condition may be diagnosed from the results of a blood test.  If you are older than age 55, your health care provider may check your cholesterol every 4-6 years.  You may be checked more often if you already have high cholesterol or other risk factors for heart disease. The blood  test for cholesterol measures:  "Bad" cholesterol (LDL cholesterol). This is the main type of cholesterol that causes heart disease. The desired level for LDL is less than 100.  "Good" cholesterol (HDL cholesterol). This type helps to protect against heart disease by cleaning the arteries and carrying the LDL away. The desired level for HDL is 60 or higher.  Triglycerides. These are fats that the body can store or burn for energy. The desired number for triglycerides is lower than 150.  Total cholesterol. This is a measure of the total amount of cholesterol in your blood, including LDL cholesterol, HDL cholesterol, and triglycerides. A healthy number is less than 200. How is this treated? This condition is treated with diet changes, lifestyle changes, and medicines. Diet changes  This may include eating more whole grains, fruits, vegetables, nuts, and fish.  This may also include cutting back on red meat and foods that have a lot of added sugar. Lifestyle changes  Changes may include getting at least 40 minutes of aerobic exercise 3 times a week. Aerobic exercises include walking, biking, and swimming. Aerobic exercise along with a healthy diet can help you maintain a healthy weight.  Changes may also include quitting smoking. Medicines  Medicines are usually given if diet and lifestyle changes have failed to reduce your cholesterol to healthy levels.  Your health care provider may prescribe a statin medicine. Statin medicines have been shown to reduce cholesterol, which can reduce the risk of heart disease. Follow these instructions at home: Eating and drinking If told by your health care provider:  Eat chicken (without skin), fish, veal, shellfish, ground [place] breast, and round or loin cuts of red meat.  Do not eat fried foods or fatty meats, such as hot dogs and salami.  Eat plenty of fruits, such as apples.  Eat plenty of vegetables, such as broccoli, potatoes, and  carrots.  Eat beans, peas, and lentils.  Eat grains such as barley, rice, couscous, and bulgur wheat.  Eat pasta without cream sauces.  Use skim or nonfat milk, and eat low-fat or nonfat yogurt and cheeses.  Do not eat or drink whole milk, cream, ice cream, egg yolks, or hard cheeses.  Do not eat stick margarine or tub margarines that contain trans fats (also called partially hydrogenated oils).  Do not eat saturated tropical oils, such as coconut oil and palm oil.  Do not eat cakes, cookies, crackers, or other baked goods that contain trans fats.  General instructions  Exercise as  directed by your health care provider. Increase your activity level with activities such as gardening, walking, and taking the stairs.  Take over-the-counter and prescription medicines only as told by your health care provider.  Do not use any products that contain nicotine or tobacco, such as cigarettes and e-cigarettes. If you need help quitting, ask your health care provider.  Keep all follow-up visits as told by your health care provider. This is important. Contact a health care provider if:  You are struggling to maintain a healthy diet or weight.  You need help to start on an exercise program.  You need help to stop smoking. Get help right away if:  You have chest pain.  You have trouble breathing. This information is not intended to replace advice given to you by your health care provider. Make sure you discuss any questions you have with your health care provider. Document Revised: 12/08/2017 Document Reviewed: 06/04/2016 Elsevier Patient Education  [place]. Liver Function Tests Why am I having this test? Liver function tests are done to see how well your liver is working. The proteins and enzymes measured in the test can alert your health care provider to inflammation, damage, or disease in your liver. It is common to have liver function tests:  When you are taking certain  medicines.  If you have liver disease.  If you drink a lot of alcohol.  When you are not feeling well.  When you have other conditions that may affect your liver.  During annual physical exams.  If you have symptoms such as yellowing of the skin (jaundice), abdominal pain, or nausea and vomiting. What is being tested? These tests measure various substances in your blood. This may include:  Alanine transaminase (ALT). This is an enzyme in the liver.  Aspartate transaminase (AST). This is an enzyme in the liver, heart, and muscles.  Alkaline phosphatase (ALP). This is a protein in the liver, bile ducts, bone, and other body tissues.  Total bilirubin. This is a yellow pigment in bile.  Albumin. This is a protein in the liver.  Prothrombin time and international normalized ratio (PT and INR). PT measures the time it takes for your blood to clot. INR is a calculation of blood clotting time based on your PT result. It is also calculated based on normal ranges defined by the lab that processed your test.  Total protein. This includes two proteins, albumin and globulin, found in the blood. What kind of sample is taken?  A blood sample is required for this test. It is usually collected by inserting a needle into a blood vessel. How do I prepare for this test? How you prepare will depend on which tests are being done and the reason for doing them. You may need to:  Avoid eating for 4-6 hours before the test, or as told by your health care provider.  Stop taking certain medicines before your blood test, as told by your health care provider. Tell a health care provider about:  All medicines you are taking, including vitamins, herbs, eye drops, creams, and over-the-counter medicines.  Any medical conditions you have.  Whether you are pregnant or may be pregnant. How are the results reported? Your test results will be reported as values. Your health care provider will compare your  results to normal ranges that were established after testing a large group of people (reference ranges). Reference ranges may vary among labs and hospitals. For the substances measured in liver function tests, common  reference ranges are: ALT  Infant: 10-40 international units/L.  Child or adult: 4-36 international units/L at 37C or 4-36 units/L (SI units).  Reference ranges may be higher for older adults. AST  Newborn 72-80 days old: 35-140 units/L.  Child younger than 2 years old: 15-60 units/L.  55-61 years old: 15-50 units/L.  23-28 years old: 10-50 units/L.  32-3 years old: 10-40 units/L.  Adult: 0-35 units/L or 0-0.58 microkatals/L (SI units).  Reference ranges may be higher for older adults. ALP  Child younger than 80 years old: 85-235 units/L.  38-39 years old: 65-210 units/L.  87-49 years old: 60-300 units/L.  75-3 years old: 30-200 units/L.  Adult: 30-120 units/L or 0.5-2.0 microkatals/L (SI units).  Reference ranges may be higher for older adults. Total bilirubin  Newborn: 1.0-12.0 mg/dL or 17.1-205 micromoles/L (SI units).  Child or adult: 0.3-1.0 mg/dL or 5.1-17 micromoles/L. Albumin  Premature infant: 3.0-4.2 g/dL.  Newborn: 3.5-5.4 g/dL.  Infant: 4.4-5.4 g/dL.  Child: 4.0-5.9 g/dL.  Adult: 3.5-5.0 g/dL or 35-50 g/L (SI units). PT  11.0-12.5 seconds; 85%-100%. INR  0.8-1.1. Total protein  Premature infant: 4.2-7.6 g/dL.  Newborn: 4.6-7.4 g/dL.  Infant: 6.0-6.7 g/dL.  Child: 6.2-8.0 g/dL.  Adult: 6.4-8.3 g/dL or 64-83 g/L (SI units). What do the results mean? Results that are within the reference ranges are considered normal. For each substance measured, results outside the reference range can indicate various health issues. ALT  Levels above the normal range may indicate liver disease. AST  Levels above the normal range may indicate liver disease. Sometimes levels also increase after burns, surgery, heart attack, muscle damage, or  seizure. ALP  Levels above the normal range may be seen in biliary obstruction, liver diseases, bone disease, thyroid disease, tumors, fractures, leukemia, lymphoma, or several other conditions. People with blood type O or B may show higher levels after a fatty meal.  Levels below the normal range may indicate bone and teeth conditions, malnutrition, protein deficiency, or Wilson's disease. Total bilirubin  Levels above the normal range may indicate problems with the liver, gallbladder, or bile ducts. Albumin  Levels above the normal range may indicate dehydration. They may also be caused by a diet that is high in protein.  Levels below the normal range may indicate kidney disease, liver disease, or malabsorption of nutrients. PT and INR  Levels above the normal range mean that your blood is clotting slower than normal. This may be due to blood disorders, liver disorders, or low levels of vitamin K. Total protein  Levels above the normal range may be due to infection or other diseases.  Levels below the normal range may be due to an immune system disorder, bleeding, burns, kidney disorder, liver disease, trouble absorbing or getting nutrients, or other conditions that affect the intestines. Talk with your health care provider about what your results mean. Questions to ask your health care provider Ask your health care provider, or the department that is doing the test:  When will my results be ready?  How will I get my results?  What are my treatment options?  What other tests do I need?  What are my next steps? Summary  Liver function tests are done to see how well your liver is working.  These tests measure various proteins and enzymes in your blood. The results can alert your health care provider to inflammation, damage, or disease in your liver.  Talk with your health care provider about what your results mean. This information is not intended  to replace advice given to  you by your health care provider. Make sure you discuss any questions you have with your health care provider. Document Revised: 07/25/2018 Document Reviewed: 09/19/2017 Elsevier Patient Education  [place]. Ferritin Test Why am I having this test? The ferritin test is performed to determine if you have anemia due to iron deficiency. The test provides an indication of how much iron is stored in your body. What is being tested? This test measures the level of ferritin in your blood. Ferritin helps your body make red blood cells and the protein hemoglobin. Over time, a low ferritin level will result in a low hemoglobin and red blood cell count. This can lead to symptoms of iron deficiency, such as shortness of breath. What kind of sample is taken?  A blood sample is required for this test. It is usually collected by inserting a needle into a blood vessel. How are the results reported? Your test results will be reported as values. Your health care provider will compare your results to normal ranges that were established after testing a large group of people (reference ranges). Reference ranges may vary among labs and hospitals. For this test, common reference ranges are:  Males: 12-300 ng/mL or 12-300 mcg/L (SI units).  Females: 10-150 ng/mL or 10-150 mcg/L (SI units).  Children or adolescents: ? Newborn: 25-200 ng/mL. ? Less than or equal to 62 month old: 200-600 ng/mL. ? 2-5 months old: 50-200 ng/mL. ? 6 months to 47 years old: 7-142 ng/mL. What do the results mean?  Results that are above the reference range may indicate: ? Anemia due to causes other than iron deficiency. These include alcoholism. ? Inflammatory diseases. Examples include collagen vascular disease and chronic hepatitis. ? Certain types of cancer. These include leukemia. ? Disorders that cause iron overload in the body, such as hemochromatosis or hemosiderosis.  Results that are below the reference range may  indicate iron deficiency anemia. Talk with your health care provider about what your results mean. Questions to ask your health care provider Ask your health care provider, or the department that is doing the test:  When will my results be ready?  How will I get my results?  What are my treatment options?  What other tests do I need?  What are my next steps? Summary  The ferritin test is performed to determine if you have anemia due to iron deficiency. The test provides an indication of how much iron is stored in your body.  Ferritin helps your body make red blood cells and the protein hemoglobin.  Levels of ferritin that are above or below the reference range may indicate some diseases, such as anemia, leukemia, hepatitis, or hemochromatosis.  Talk with your health care provider about what your results mean. This information is not intended to replace advice given to you by your health care provider. Make sure you discuss any questions you have with your health care provider. Document Revised: 08/28/2017 Document Reviewed: 08/28/2017 Elsevier Patient Education  [place].  Creatine Information Creatine is a chemical that muscles need in order to tighten (contract) during activity. Creatine helps the body produce a molecule called adenosine triphosphate (ATP). ATP is the main source of energy that muscles need to function. It creates a small amount of energy very quickly without the need for oxygen. In some cases, creatine supplementation may make this process faster and more efficient. Creatine is naturally produced in the body and is found in certain foods,  such as fish and red meat. It is also available as a dietary supplement, which is usually in the form of a powder or a sports drink. Why is creatine used in athletics? Creatine supplements may be used to:  Build muscle.  Increase muscle strength.  Increase the rate of recovery from injury.  Improve performance in  short-duration, high-intensity exercise.  Increase training intensity.  Improve the quality of workouts. What are the side effects of using creatine? Possible side effects of creatine supplements include:  Muscle cramps.  Muscle tightness.  Weight gain.  Nausea.  Stomach cramps.  Diarrhea.  A decrease in your body's ability to produce creatine naturally after you stop using synthetic creatine supplements. What do I need to know about creatine use?  General information Usually, professional sports associations do not ban the use of creatine supplements. Creatine may help to improve athletic performance in young, healthy people during short periods of rigorous physical activity, such as sprinting or weight lifting. However, creatine supplements do not improve athletic performance in every person who uses them. Some athletes eat carbohydrates, such as bread, pasta, and potatoes (carbohydrate loading) along with creatine supplements to enhance energy levels and performance even more. Warnings Do not take creatine if you have a history of kidney disease or you have conditions such as diabetes that increase your risk of kidney problems. Do not combine creatine supplements with caffeine or other stimulants or with herbal performance enhancers. Doing this could increase your risk for serious side effects, including stroke. Take these actions to lower your risk of side effects when you use creatine supplements:  Drink enough water to keep your urine pale yellow.  Make sure you get the recommended amount (Recommended Dietary Allowance, or RDA) of potassium. Summary  Creatine is a chemical that muscles need in order to tighten (contract) during activity.  Creatine is naturally produced in the body and is found in certain foods, such as fish and red meat.  Creatine is available as a dietary supplement, which is usually in the form of a powder or a sports drink.  Usually, professional  sports associations do not ban the use of creatine supplements. This information is not intended to replace advice given to you by your health care provider. Make sure you discuss any questions you have with your health care provider. Document Revised: 10/16/2018 Document Reviewed: 10/16/2018 Elsevier Patient Education  [place].  High-Fiber Diet Fiber, also called dietary fiber, is a type of carbohydrate that is found in fruits, vegetables, whole grains, and beans. A high-fiber diet can have many health benefits. Your health care provider may recommend a high-fiber diet to help:  Prevent constipation. Fiber can make your bowel movements more regular.  Lower your cholesterol.  Relieve the following conditions: ? Swelling of veins in the anus (hemorrhoids). ? Swelling and irritation (inflammation) of specific areas of the digestive tract (uncomplicated diverticulosis). ? A problem of the large intestine (colon) that sometimes causes pain and diarrhea (irritable bowel syndrome, IBS).  Prevent overeating as part of a weight-loss plan.  Prevent heart disease, type 2 diabetes, and certain cancers. What is my plan? The recommended daily fiber intake in grams (g) includes:  38 g for men age 35 or younger.  30 g for men over age 28.  55 g for women age 7 or younger.  21 g for women over age 32. You can get the recommended daily intake of dietary fiber by:  Eating a variety of fruits, vegetables,  grains, and beans.  Taking a fiber supplement, if it is not possible to get enough fiber through your diet. What do I need to know about a high-fiber diet?  It is better to get fiber through food sources rather than from fiber supplements. There is not a lot of research about how effective supplements are.  Always check the fiber content on the nutrition facts label of any prepackaged food. Look for foods that contain 5 g of fiber or more per serving.  Talk with a diet and  nutrition specialist (dietitian) if you have questions about specific foods that are recommended or not recommended for your medical condition, especially if those foods are not listed below.  Gradually increase how much fiber you consume. If you increase your intake of dietary fiber too quickly, you may have bloating, cramping, or gas.  Drink plenty of water. Water helps you to digest fiber. What are tips for following this plan?  Eat a wide variety of high-fiber foods.  Make sure that half of the grains that you eat each day are whole grains.  Eat breads and cereals that are made with whole-grain flour instead of refined flour or white flour.  Eat brown rice, bulgur wheat, or millet instead of white rice.  Start the day with a breakfast that is high in fiber, such as a cereal that contains 5 g of fiber or more per serving.  Use beans in place of meat in soups, salads, and pasta dishes.  Eat high-fiber snacks, such as berries, raw vegetables, nuts, and popcorn.  Choose whole fruits and vegetables instead of processed forms like juice or sauce. What foods can I eat?  Fruits Berries. Pears. Apples. Oranges. Avocado. Prunes and raisins. Dried figs. Vegetables Sweet potatoes. Spinach. Kale. Artichokes. Cabbage. Broccoli. Cauliflower. Green peas. Carrots. Squash. Grains Whole-grain breads. Multigrain cereal. Oats and oatmeal. Brown rice. Barley. Bulgur wheat. [place]. Quinoa. Bran muffins. Popcorn. Rye wafer crackers. Meats and other proteins [place], kidney, and pinto beans. Soybeans. Split peas. Lentils. Nuts and seeds. Dairy Fiber-fortified yogurt. Beverages Fiber-fortified soy milk. Fiber-fortified orange juice. Other foods Fiber bars. The items listed above may not be a complete list of recommended foods and beverages. Contact a dietitian for more options. What foods are not recommended? Fruits Fruit juice. Cooked, strained fruit. Vegetables Fried potatoes. Canned vegetables.  Well-cooked vegetables. Grains White bread. Pasta made with refined flour. White rice. Meats and other proteins Fatty cuts of meat. Fried chicken or fried fish. Dairy Milk. Yogurt. Cream cheese. Sour cream. Fats and oils Butters. Beverages Soft drinks. Other foods Cakes and pastries. The items listed above may not be a complete list of foods and beverages to avoid. Contact a dietitian for more information. Summary  Fiber is a type of carbohydrate. It is found in fruits, vegetables, whole grains, and beans.  There are many health benefits of eating a high-fiber diet, such as preventing constipation, lowering blood cholesterol, helping with weight loss, and reducing your risk of heart disease, diabetes, and certain cancers.  Gradually increase your intake of fiber. Increasing too fast can result in cramping, bloating, and gas. Drink plenty of water while you increase your fiber.  The best sources of fiber include whole fruits and vegetables, whole grains, nuts, seeds, and beans. This information is not intended to replace advice given to you by your health care provider. Make sure you discuss any questions you have with your health care provider. Document Revised: 10/09/2017 Document Reviewed: 10/09/2017 Elsevier Patient Education  2020 Elsevier Inc.  

## 2020-06-15 ENCOUNTER — Ambulatory Visit: Payer: 59 | Attending: Family

## 2020-06-15 DIAGNOSIS — Z20822 Contact with and (suspected) exposure to covid-19: Secondary | ICD-10-CM

## 2020-06-16 LAB — NOVEL CORONAVIRUS, NAA: SARS-CoV-2, NAA: NOT DETECTED

## 2020-06-16 LAB — SARS-COV-2, NAA 2 DAY TAT

## 2020-07-03 DIAGNOSIS — R0683 Snoring: Secondary | ICD-10-CM | POA: Diagnosis not present

## 2020-07-03 DIAGNOSIS — J0140 Acute pansinusitis, unspecified: Secondary | ICD-10-CM | POA: Diagnosis not present

## 2020-07-03 DIAGNOSIS — I10 Essential (primary) hypertension: Secondary | ICD-10-CM | POA: Diagnosis not present

## 2020-07-03 DIAGNOSIS — G4719 Other hypersomnia: Secondary | ICD-10-CM | POA: Diagnosis not present

## 2020-08-06 ENCOUNTER — Other Ambulatory Visit: Payer: Self-pay | Admitting: Internal Medicine

## 2020-08-06 DIAGNOSIS — Z9889 Other specified postprocedural states: Secondary | ICD-10-CM

## 2020-11-25 ENCOUNTER — Other Ambulatory Visit: Payer: Self-pay | Admitting: Obstetrics and Gynecology

## 2021-01-07 ENCOUNTER — Other Ambulatory Visit: Payer: Self-pay

## 2021-01-07 ENCOUNTER — Other Ambulatory Visit: Payer: Self-pay | Admitting: Family Medicine

## 2021-01-07 DIAGNOSIS — Z1152 Encounter for screening for COVID-19: Secondary | ICD-10-CM

## 2021-01-09 LAB — SARS-COV-2, NAA 2 DAY TAT

## 2021-01-09 LAB — NOVEL CORONAVIRUS, NAA: SARS-CoV-2, NAA: NOT DETECTED

## 2021-01-11 ENCOUNTER — Ambulatory Visit: Payer: Self-pay | Admitting: Adult Medicine

## 2021-01-11 ENCOUNTER — Encounter: Payer: Self-pay | Admitting: Adult Medicine

## 2021-01-11 ENCOUNTER — Other Ambulatory Visit: Payer: Self-pay

## 2021-01-11 VITALS — BP 122/96 | HR 77 | Temp 98.6°F | Resp 14 | Ht 66.0 in | Wt 233.0 lb

## 2021-01-11 DIAGNOSIS — J0140 Acute pansinusitis, unspecified: Secondary | ICD-10-CM

## 2021-01-11 MED ORDER — ZINC GLUCONATE 50 MG PO TABS: 25.0000 mg | ORAL_TABLET | Freq: Two times a day (BID) | ORAL | 1 refills | Status: AC

## 2021-01-11 MED ORDER — VITAMIN C-ROSE HIPS 1000 MG PO TABS: 1000.0000 mg | ORAL_TABLET | Freq: Four times a day (QID) | ORAL | 1 refills | Status: AC

## 2021-01-11 MED ORDER — ERYTHROMYCIN ETHYLSUCCINATE 400 MG PO TABS: 400.0000 mg | ORAL_TABLET | Freq: Three times a day (TID) | ORAL | 0 refills | Status: AC

## 2021-01-11 NOTE — Progress Notes (Signed)
Covid test Negative on 01/07/21.  S/Sx since Wed (0119/22) Sinus pressure Ears hurt Throat hurts Temp 99 on Sat Tired & no energy Cough - dry  Has been taking tylenol & ibuprofen.  Hx of HTN [place] BP = 122/96  BP Recheck [place] = 110/90  AMD

## 2021-01-11 NOTE — Progress Notes (Signed)
HISTORY  Chief Complaint Sinus pressure  HPI Felicia Barker is a 50 y.o. female     01/06/21 developed scratchy throat which resolved. 4d with progressive headache, face pain   With dry mild intermittent cough.  Recently covid neg.  Reports hx of allergy to pcn  Past Medical History:  Diagnosis Date  . Abnormal cervical cytology   . Anxiety    with loss of Dad  . Endometriosis   . Hip pain, chronic, left   . Hypertension   . Insomnia   . Kidney function abnormal    with left ovary /ablatiion surgery  . Low back pain   . Seasonal allergies     Patient Active Problem List   Diagnosis Date Noted  . Elevated AST (SGOT) 03/04/2020  . Elevated ferritin 03/04/2020  . Dyslipidemia (high LDL; low HDL) 03/04/2020  . Elevated serum creatinine 03/04/2020  . Axillary pain, right 09/19/2019  . Anxiety state 09/19/2019  . Left foot pain 09/05/2019  . Breast cancer screening 09/03/2019  . Other hyperlipidemia 09/03/2019  . Allergic rhinitis 09/03/2019  . Fatigue 08/05/2019  . Other insomnia 08/05/2019  . History of endogenous hypertriglyceridemia 08/05/2019  . Essential hypertension 07/02/2019  . Severe obesity (BMI >= 40) ([place]) 07/02/2019  . Nonintractable episodic headache 07/02/2019  . Chronic pelvic pain in female 02/10/2015  . S/P endometrial ablation 02/10/2015  . Hematologic disorder 02/10/2015    Past Surgical History:  Procedure Laterality Date  . ABDOMINAL HYSTERECTOMY    . ABLATION  oct 2014  . CHOLECYSTECTOMY    . CYSTOSCOPY N/A 05/12/2015   Procedure: CYSTOSCOPY;  Surgeon: Malachy Mood, MD;  Location: [facility] ORS;  Service: Gynecology;  Laterality: N/A;  . DIAGNOSTIC LAPAROSCOPY  left ovary  . DILATION AND CURETTAGE OF UTERUS     leep  . LAPAROSCOPIC HYSTERECTOMY N/A 05/12/2015   Procedure: HYSTERECTOMY TOTAL LAPAROSCOPIC;  Surgeon: Malachy Mood, MD;  Location: [facility] ORS;  Service: Gynecology;  Laterality: N/A;  . LAPAROSCOPIC SALPINGO OOPHERECTOMY Right  05/12/2015   Procedure: LAPAROSCOPIC SALPINGO OOPHORECTOMY;  Surgeon: Malachy Mood, MD;  Location: [facility] OR[facility];  Service: Gynecology;  Laterality: Right;    Prior to Admission medications   Medication Sig Start Date End Date Taking? Authorizing Provider  Ascorbic Acid (VITAMIN C WITH ROSE HIPS) 1000 MG tablet Take 1 tablet (1,000 mg total) by mouth every 6 (six) hours for 10 days. 01/11/21 01/21/21 Yes Cecil Cobbs, MD  bisoprolol-hydrochlorothiazide [facility]) 5-6.25 MG tablet Take 1 tablet by mouth every morning. 03/04/20 02/27/21 Yes Betancourt, Aura Fey, NP  erythromycin ethylsuccinate (EES) 400 MG tablet Take 1 tablet (400 mg total) by mouth 3 (three) times daily for 7 days. 01/11/21 01/18/21 Yes Cecil Cobbs, MD  estradiol (ESTRACE) 2 MG tablet TAKE 1 TABLET BY MOUTH EVERY DAY 08/07/20  Yes Earleen Newport, MD  valsartan (DIOVAN) 80 MG tablet Take 1 tablet (80 mg total) by mouth daily. 03/04/20 02/27/21 Yes Betancourt, Aura Fey, NP  zinc gluconate 50 MG tablet Take 0.5 tablets (25 mg total) by mouth in the morning and at bedtime for 10 days. 01/11/21 01/21/21 Yes Cecil Cobbs, MD  cetirizine (ZYRTEC) 10 MG tablet Take 10 mg by mouth every morning. Patient not taking: Reported on 01/11/2021    [provider]  ibuprofen (ADVIL) 600 MG tablet TAKE 1 TABLET BY MOUTH EVERY 6 HOURS AS NEEDED 11/25/20   Malachy Mood, MD  naproxen (NAPROSYN) 500 MG tablet Take by mouth.    [provider]  Allergies Ampicillin, Apple, Cherry, Peach [prunus persica], Sulfa antibiotics, Amoxicillin, and Stadol [butorphanol]  Family History  Problem Relation Age of Onset  . Breast cancer Maternal Aunt 60       2nd onset at age 6  . Breast cancer Maternal Grandmother 77  . Breast cancer Cousin 47  . Heart disease Mother   . COPD Mother   . Heart attack Father   . Parkinson's disease Other   . Dementia Other     Social History noncontributory  Review of Systems  Constitutional: No  fever/chills Eyes: No visual changes. ENT: No sore throat. Face pain, neck soreness Cardiovascular: Denies chest pain. Respiratory: Denies shortness of breath. Gastrointestinal: No abdominal pain.  No nausea, no vomiting.  No diarrhea.  No constipation. Genitourinary: Negative for dysuria. Musculoskeletal: Negative for back pain. Skin: Negative for rash. Neurological:  Frontal headaches, no  focal weakness or numbness.     ____________________________________________   PHYSICAL EXAM:  VITAL SIGNS:  Afebrile 122/96 wf not feeling well, fatigue headache increasing face, neck pressure/pain  Constitutional: Alert and oriented. Well appearing and in no acute distress. Eyes: Conjunctivae are normal. PERRL. EOMI. Head: Atraumatic. Nose: No congestion/rhinnorhea. Very tender frontal maxillary sinus area Mouth/Throat: Mucous membranes are moist.  Oropharynx non-erythematous  Neck: No stridor.  cervical spine tenderness to palpation. Lymphatic/ submandible tender lymphadenopathy. Cardiovascular: Normal rate, regular rhythm. Grossly normal heart sounds.  Good peripheral circulation. Respiratory: Normal respiratory effort.  No retractions. Lungs CTAB. Gastrointestinal: Soft and nontender. No distention. No abdominal bruits. No CVA tenderness. Musculoskeletal: No lower extremity tenderness nor edema.  No joint effusions. Neurologic:  Normal speech and language. No gross focal neurologic deficits are appreciated. No gait instability. Skin:  Skin is warm, dry and intact. No rash noted. Psychiatric: Mood and affect are normal for clinical presentation. Speech and behavior are normal.  ____________________________________________   IMPRESSION / ASSESSMENT Pansinusitis, acute  Hx of pcn allergy EES 400 tid 10day Instruct to add vitc 1gm tid, zinc 25mg  bid, netty pot nasal wash

## 2021-01-12 ENCOUNTER — Ambulatory Visit: Payer: 59

## 2021-01-12 ENCOUNTER — Other Ambulatory Visit: Payer: Self-pay

## 2021-02-01 ENCOUNTER — Ambulatory Visit: Payer: Self-pay

## 2021-02-01 DIAGNOSIS — Z1152 Encounter for screening for COVID-19: Secondary | ICD-10-CM

## 2021-02-11 ENCOUNTER — Ambulatory Visit
Admission: RE | Admit: 2021-02-11 | Discharge: 2021-02-11 | Disposition: A | Discharge status: Home / Self Care | Payer: 59 | Attending: Physician Assistant | Admitting: Physician Assistant

## 2021-02-11 ENCOUNTER — Ambulatory Visit: Payer: Self-pay | Admitting: Physician Assistant

## 2021-02-11 ENCOUNTER — Encounter: Payer: Self-pay | Admitting: Physician Assistant

## 2021-02-11 ENCOUNTER — Ambulatory Visit
Admission: RE | Admit: 2021-02-11 | Discharge: 2021-02-11 | Disposition: A | Discharge status: Home / Self Care | Payer: 59 | Source: Ambulatory Visit | Attending: Physician Assistant | Admitting: Physician Assistant

## 2021-02-11 ENCOUNTER — Other Ambulatory Visit: Payer: Self-pay

## 2021-02-11 VITALS — BP 143/83 | Temp 97.5°F | Resp 14 | Ht 66.0 in | Wt 233.0 lb

## 2021-02-11 DIAGNOSIS — M25562 Pain in left knee: Secondary | ICD-10-CM | POA: Insufficient documentation

## 2021-02-11 DIAGNOSIS — M1712 Unilateral primary osteoarthritis, left knee: Secondary | ICD-10-CM | POA: Diagnosis not present

## 2021-02-11 NOTE — Progress Notes (Signed)
   Subjective: Left knee pain    Patient ID: Felicia Barker, female    DOB: [birth year], 50 y.o.   MRN: [id number]  HPI Patient presents with left knee pain for approximately 1 week.  Patient states pain increases due to sitting in a high chair with her feet dangling.  Patient rates the pain 7/10.  Patient described pain as "achy".  Patient the pain is in the anterior and popliteal area of the knee.  Patient state pain decreases will have feet are flat on the ground on a regular chair.  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   Review of Systems Anxiety, hyperlipidemia, and fatigue.    Objective:   Physical Exam No acute distress.  BMI is greater than 40.  No obvious deformity edema, or erythema to the left knee.  Patient has moderate guarding palpation anterior and popliteal area of the left knee.  Patient has multiple varicose veins.       Assessment & Plan: Knee pain.  Differential is consistent with arthritis, gout, and pain from venous distention.  Further evaluation is warranted x-ray of the left knee and lab test.  Patient advised if she must continue sitting with her legs dangling to purchase over-the-counter compression stockings.  Use them only for work requires sitting up position.  Advise over-the-counter NSAIDs pending results of lab and x-ray.

## 2021-02-12 LAB — SEDIMENTATION RATE: Sed Rate: 37 mm/hr — ABNORMAL HIGH (ref 0–32)

## 2021-02-12 LAB — URIC ACID: Uric Acid: 6.4 mg/dL — ABNORMAL HIGH (ref 2.6–6.2)

## 2021-04-22 ENCOUNTER — Other Ambulatory Visit: Payer: Self-pay

## 2021-04-22 DIAGNOSIS — I10 Essential (primary) hypertension: Secondary | ICD-10-CM

## 2021-04-22 MED ORDER — BISOPROLOL-HYDROCHLOROTHIAZIDE 5-6.25 MG PO TABS: 1.0000 | ORAL_TABLET | Freq: Every morning | ORAL | 0 refills | Status: AC

## 2021-04-26 ENCOUNTER — Telehealth: Payer: Self-pay | Admitting: Registered Nurse

## 2021-04-26 DIAGNOSIS — I10 Essential (primary) hypertension: Secondary | ICD-10-CM

## 2021-04-26 DIAGNOSIS — R7989 Other specified abnormal findings of blood chemistry: Secondary | ICD-10-CM

## 2021-04-26 DIAGNOSIS — E785 Hyperlipidemia, unspecified: Secondary | ICD-10-CM

## 2021-04-26 DIAGNOSIS — R7401 Elevation of levels of liver transaminase levels: Secondary | ICD-10-CM

## 2021-04-26 NOTE — Telephone Encounter (Signed)
Patient seen 03/04/2020 by me "Continue current medications as directed. Electronic Rx sent to her pharmacy of choice for ziac 5/6.$RemoveBefo'25mg'fLyNiBilBut$  po daily #90 RF3 and diovan $RemoveBe'80mg'jnNeLZczL$  po daily #90 RF3. Start DASH diet and exercise program 150 minutes exercise/activity per week.  Recommended weight loss/weight maintenance to BMI less than 30. Return to the clinic if any new symptoms/notify clinic staff if visual changes, frequent headache, chest pain or dyspnea on mild or  minimal exertion. Exitcare handout printed and given on DASH diet and  managing hypertension.  If no improvement BP with weight loss may need to adjust her medications again.  Patient verbalized agreement and understanding of treatment plan and had no further questions at this time. P2: Diet and Exercise specific for HTN   Discussed lifestyle modification re diet to decrease white starches/sugars e.g. potatoes/bread increase omega 3 sources nuts, fish options add more fruits and vegetables and lean protein choices such as hummus or yogurt. Patient given exitcare handout on high cholesterol and dash diet. Discussed weight loss. Repeat lipids in 6-12 months consider adding statin if no improvement with TLC.  Patient agreed with plan of care and verbalized understanding of information/instructions and had no further questions at this time.   Elevated AST ?fatty liver disease.  I recommended weight loss.  Recently had been taking a lot of NSAIDS also.  Discussed her chronic medications could be contributing also.  Repeat LFTs in 6 months after TLC nonfasting. exitcare handout printed and given on liver function tests. Patient verbalized understanding information/instructions, agreed with plan of care and had no further questions at this time.  BMI 40 decrease non-nutritional added sugars in diet and increase activity.  Has iphone and fitness watch going to start using again.  Discussed exercise can be in 5 minute spurts throughout the day does not need to  be 30 minute intervals to have health benefits.  Increase water intake  Work up to 150 minutes exercise per week and increasing steps 100-200 steps per day total of 1000 [place] per [place] [id number] per day met may take up to one year.  Currently average 2700 steps per day.  Check weight at each office visit.  Patient verbalized understanding information/instructions, agreed with plan of care and had no further questions at this time.  Elevated creatinine may have been due to situational dehydration fasting for labs or due to nsaid use that she has now stopped.  Recheck in 6 months sooner if new urinary symptoms eg dysuria, tea or cola colored urine, back/flank pain, hand/feet swelling.  Patient verbalized understanding information/instructions, agreed with plan of care and had no further questions at this time  Elevated ferritin improving from previous patient will continue to monitor intake of iron rich foods and repeat lab in 6 months nonfasting.  Patient given exitcare handout on ferritin printed.  Avoid any supplements with iron/ferritin and known intake of cereals/enriched foods above 100% RDA. Discussed with patient excess iron can be deposited in organs and cause further health problems.  Patient verbalized understanding information/instructions, agreed with plan of care and had no further questions at this time."  Epic reminder received had not completed follow up labs or had annual visit this year  Saw PA Smith for knee pain 02/11/21 BP 143/83 BMI 37.61.  Patient overdue for executive panel, Hgba1c, and ferritin  Please schedule patient with PA Tamala Julian unless she has had follow up with PCM not on Epic.

## 2021-04-26 NOTE — Telephone Encounter (Signed)
Noted patient no longer eligible for care at [facility] clinic.

## 2021-04-27 NOTE — Telephone Encounter (Signed)
Left message for patient due for annual follow up with new PCM since she is no longer eligible for care at [facility] clinic.  Notified patient if she has not had labs drawn since Mar 2021 it is recommended she schedule appt with PCM.

## 2021-04-28 ENCOUNTER — Other Ambulatory Visit: Payer: Self-pay | Admitting: Registered Nurse

## 2021-04-28 DIAGNOSIS — I10 Essential (primary) hypertension: Secondary | ICD-10-CM

## 2021-06-26 NOTE — Telephone Encounter (Signed)
Patient contacted via telephone. Reminded overdue for annual labs but no longer eligible for care at [facility].  Patient stated she will get established with new PCM to have labs drawn.  Discussed creatinine, AST, cholesterol were elevated on 2021 labs.  Discussed NSAIDS use could have contributed at that time.  Patient verbalized understanding information/instructions, agreed with plan of care and had no further questions at this time.  Results for Felicia Barker, WILMS" (MRN [id number]) as of 06/26/2021 16:29  Ref. Range 02/18/2020 08:40  Sodium Latest Ref Range: 134 - 144 mmol/L 139  Potassium Latest Ref Range: 3.5 - 5.2 mmol/L 4.3  Chloride Latest Ref Range: 96 - 106 mmol/L 103  Glucose Latest Ref Range: 65 - 99 mg/dL 99  BUN Latest Ref Range: 6 - 24 mg/dL 14  Creatinine Latest Ref Range: 0.57 - 1.00 mg/dL 1.11 (H)  Calcium Latest Ref Range: 8.7 - 10.2 mg/dL 8.7  BUN/Creatinine Ratio Latest Ref Range: 9 - 23  13  Phosphorus Latest Ref Range: 3.0 - 4.3 mg/dL 3.9  Alkaline Phosphatase Latest Ref Range: 39 - 117 IU/L 68  Albumin Latest Ref Range: 3.8 - 4.8 g/dL 3.7 (L)  Albumin/Globulin Ratio Latest Ref Range: 1.2 - 2.2  1.3  Uric Acid Latest Ref Range: 2.6 - 6.2 mg/dL 6.0  AST Latest Ref Range: 0 - 40 IU/L 52 (H)  ALT Latest Ref Range: 0 - 32 IU/L 28  Total Protein Latest Ref Range: 6.0 - 8.5 g/dL 6.5  Total Bilirubin Latest Ref Range: 0.0 - 1.2 mg/dL 0.2  GGT Latest Ref Range: 0 - 60 IU/L 56  GFR, Est Non African American Latest Ref Range: >59 mL/min/1.73 60  GFR, Est African American Latest Ref Range: >59 mL/min/1.73 69  Estimated CHD Risk Latest Ref Range: 0.0 - 1.0 times avg. 1.0  LDH Latest Ref Range: 119 - 226 IU/L 187  Total CHOL/HDL Ratio Latest Ref Range: 0.0 - 4.4 ratio 4.4  Cholesterol, Total Latest Ref Range: 100 - 199 mg/dL 171  HDL Cholesterol Latest Ref Range: >39 mg/dL 39 (L)  Triglycerides Latest Ref Range: 0 - 149 mg/dL 285 (H)  VLDL Cholesterol Cal  Latest Ref Range: 5 - 40 mg/dL 47 (H)  LDL Chol Calc (NIH) Latest Ref Range: 0 - 99 mg/dL 85  Iron Latest Ref Range: 27 - 159 ug/dL 79  Ferritin Latest Ref Range: 15 - 150 ng/mL 240 (H)  Globulin, Total Latest Ref Range: 1.5 - 4.5 g/dL 2.8  WBC Latest Ref Range: 3.4 - 10.8 x10E3/uL 8.2  RBC Latest Ref Range: 3.77 - 5.28 x10E6/uL 4.60  Hemoglobin Latest Ref Range: 11.1 - 15.9 g/dL 13.7  HCT Latest Ref Range: 34.0 - 46.6 % 39.7  MCV Latest Ref Range: 79 - 97 fL 86  MCH Latest Ref Range: 26.6 - 33.0 pg 29.8  MCHC Latest Ref Range: 31.5 - 35.7 g/dL 34.5  RDW Latest Ref Range: 11.7 - 15.4 % 13.1  Platelets Latest Ref Range: 150 - 450 x10E3/uL 256  Neutrophils Latest Ref Range: Not Estab. % 64  Immature Granulocytes Latest Ref Range: Not Estab. % 0  NEUT# Latest Ref Range: 1.4 - 7.0 x10E3/uL 5.2  Lymphocyte # Latest Ref Range: 0.7 - 3.1 x10E3/uL 2.3  Monocytes Absolute Latest Ref Range: 0.1 - 0.9 x10E3/uL 0.6  Basophils Absolute Latest Ref Range: 0.0 - 0.2 x10E3/uL 0.0  Immature Grans (Abs) Latest Ref Range: 0.0 - 0.1 x10E3/uL 0.0  Lymphs Latest Ref Range: Not Estab. %  27  Monocytes Latest Ref Range: Not Estab. % 7  Basos Latest Ref Range: Not Estab. % 0  Eos Latest Ref Range: Not Estab. % 2  EOS (ABSOLUTE) Latest Ref Range: 0.0 - 0.4 x10E3/uL 0.1  TSH Latest Ref Range: 0.450 - 4.500 uIU/mL 2.920  Thyroxine (T4) Latest Ref Range: 4.5 - 12.0 ug/dL 8.8  Free Thyroxine Index Latest Ref Range: 1.2 - 4.9  1.4  T3 Uptake Ratio Latest Ref Range: 24 - 39 % 16 (L)

## 2021-10-16 ENCOUNTER — Other Ambulatory Visit: Payer: Self-pay | Admitting: Nurse Practitioner

## 2021-10-16 DIAGNOSIS — I10 Essential (primary) hypertension: Secondary | ICD-10-CM

## 2022-04-01 ENCOUNTER — Other Ambulatory Visit: Payer: Self-pay | Admitting: Internal Medicine

## 2022-04-01 DIAGNOSIS — Z1231 Encounter for screening mammogram for malignant neoplasm of breast: Secondary | ICD-10-CM

## 2022-05-05 ENCOUNTER — Ambulatory Visit
Admission: RE | Admit: 2022-05-05 | Discharge: 2022-05-05 | Disposition: A | Discharge status: Home / Self Care | Payer: BC Managed Care – PPO | Source: Ambulatory Visit | Attending: Internal Medicine | Admitting: Internal Medicine

## 2022-05-05 DIAGNOSIS — Z1231 Encounter for screening mammogram for malignant neoplasm of breast: Secondary | ICD-10-CM | POA: Diagnosis present

## 2022-05-10 ENCOUNTER — Other Ambulatory Visit: Payer: Self-pay | Admitting: Internal Medicine

## 2022-05-10 ENCOUNTER — Ambulatory Visit
Admission: RE | Admit: 2022-05-10 | Discharge: 2022-05-10 | Disposition: A | Discharge status: Home / Self Care | Payer: BC Managed Care – PPO | Source: Ambulatory Visit | Attending: Internal Medicine | Admitting: Internal Medicine

## 2022-05-10 DIAGNOSIS — N6489 Other specified disorders of breast: Secondary | ICD-10-CM

## 2022-05-10 DIAGNOSIS — R928 Other abnormal and inconclusive findings on diagnostic imaging of breast: Secondary | ICD-10-CM | POA: Insufficient documentation

## 2022-05-12 ENCOUNTER — Other Ambulatory Visit: Payer: Self-pay | Admitting: Internal Medicine

## 2022-05-12 DIAGNOSIS — N63 Unspecified lump in unspecified breast: Secondary | ICD-10-CM

## 2022-05-12 DIAGNOSIS — R928 Other abnormal and inconclusive findings on diagnostic imaging of breast: Secondary | ICD-10-CM

## 2022-05-24 ENCOUNTER — Ambulatory Visit
Admission: RE | Admit: 2022-05-24 | Discharge: 2022-05-24 | Disposition: A | Discharge status: Home / Self Care | Payer: BC Managed Care – PPO | Source: Ambulatory Visit | Attending: Internal Medicine | Admitting: Internal Medicine

## 2022-05-24 DIAGNOSIS — N63 Unspecified lump in unspecified breast: Secondary | ICD-10-CM | POA: Insufficient documentation

## 2022-05-24 DIAGNOSIS — R928 Other abnormal and inconclusive findings on diagnostic imaging of breast: Secondary | ICD-10-CM | POA: Insufficient documentation

## 2022-05-24 HISTORY — PX: BREAST BIOPSY: SHX20

## 2022-05-25 LAB — SURGICAL PATHOLOGY

## 2022-08-13 IMAGING — MG MM BREAST LOCALIZATION CLIP
4 series · 4 of 12 positions shown · non-contrast
Comparison: Previous exam(s).

CLINICAL DATA: Confirmation of clip placement after
ultrasound-guided core needle biopsy of a mass involving the outer
LEFT breast at the 9 o'clock location.

EXAM:
2D and 3D DIAGNOSTIC RIGHT MAMMOGRAM POST ULTRASOUND BIOPSY

[R ML synth-2D]
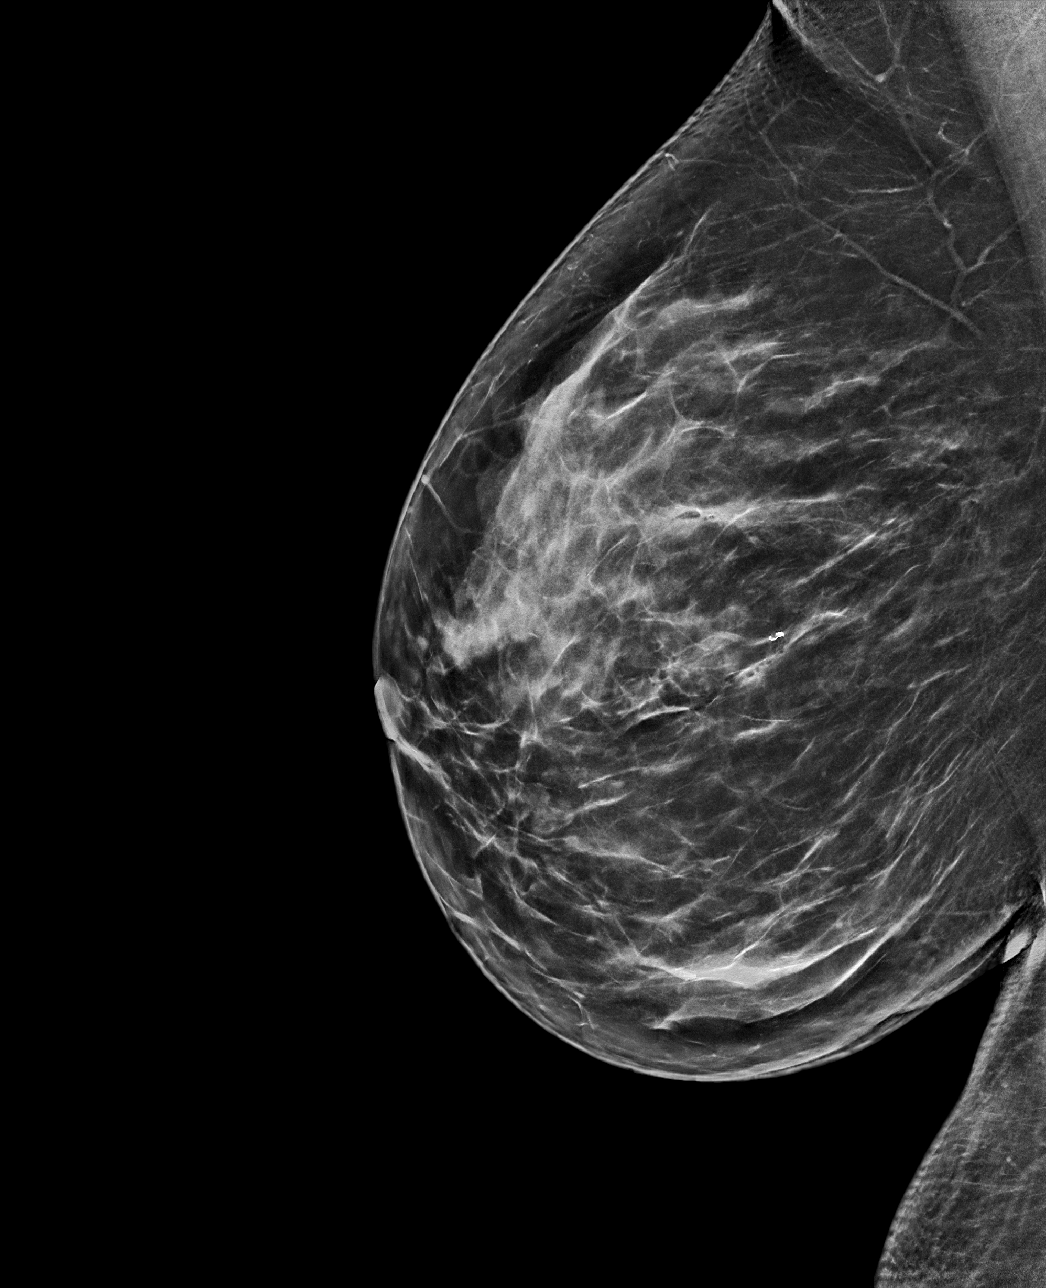

[R CC synth-2D]
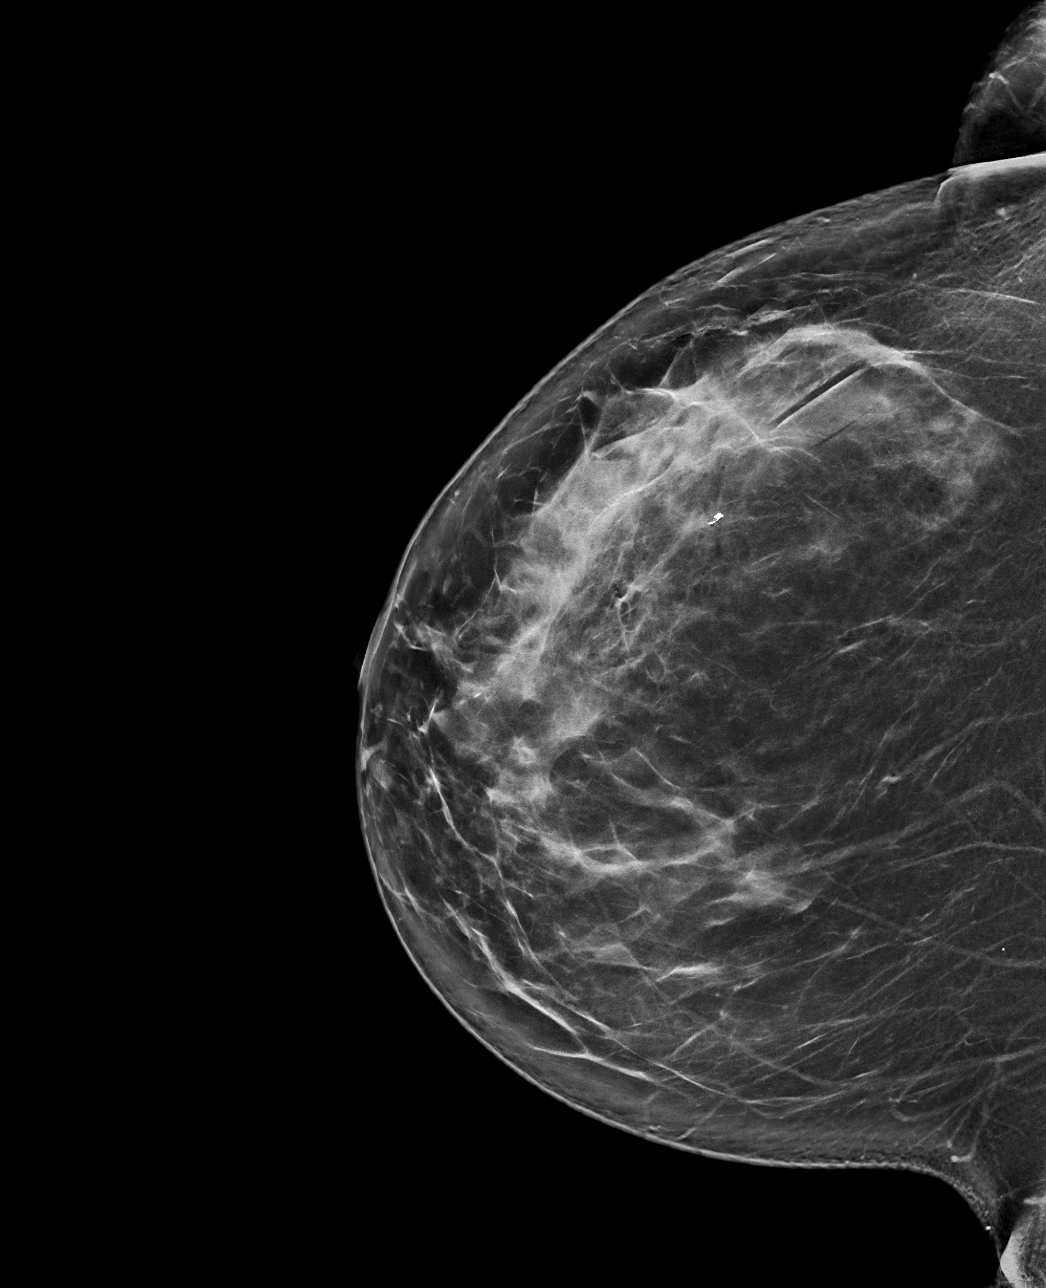

[R ML tomo · tomo slice 45/89.0]
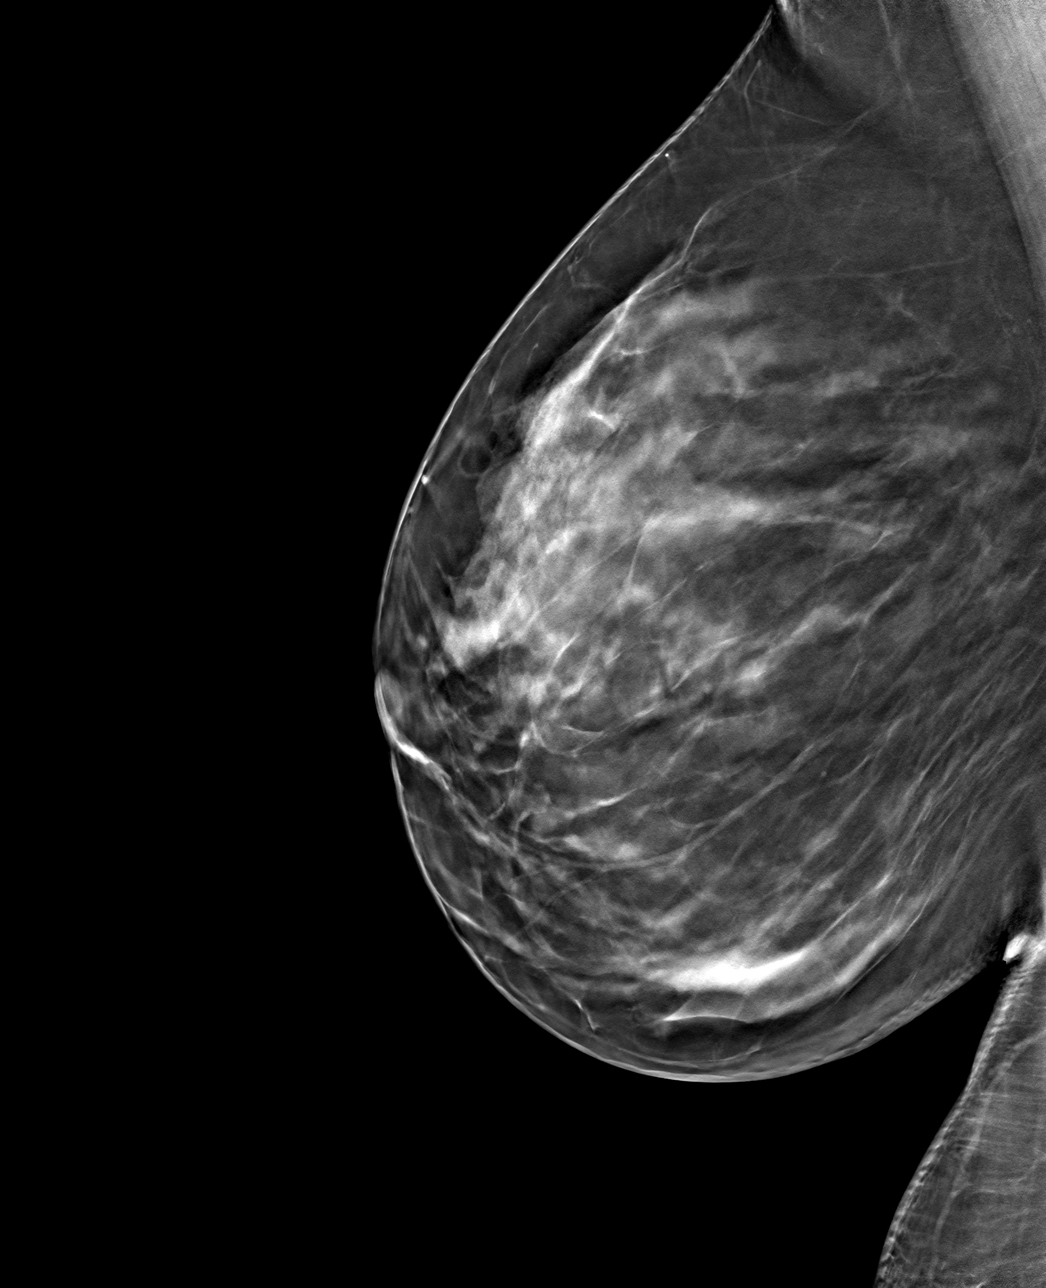

[R CC tomo · tomo slice 47/93.0]
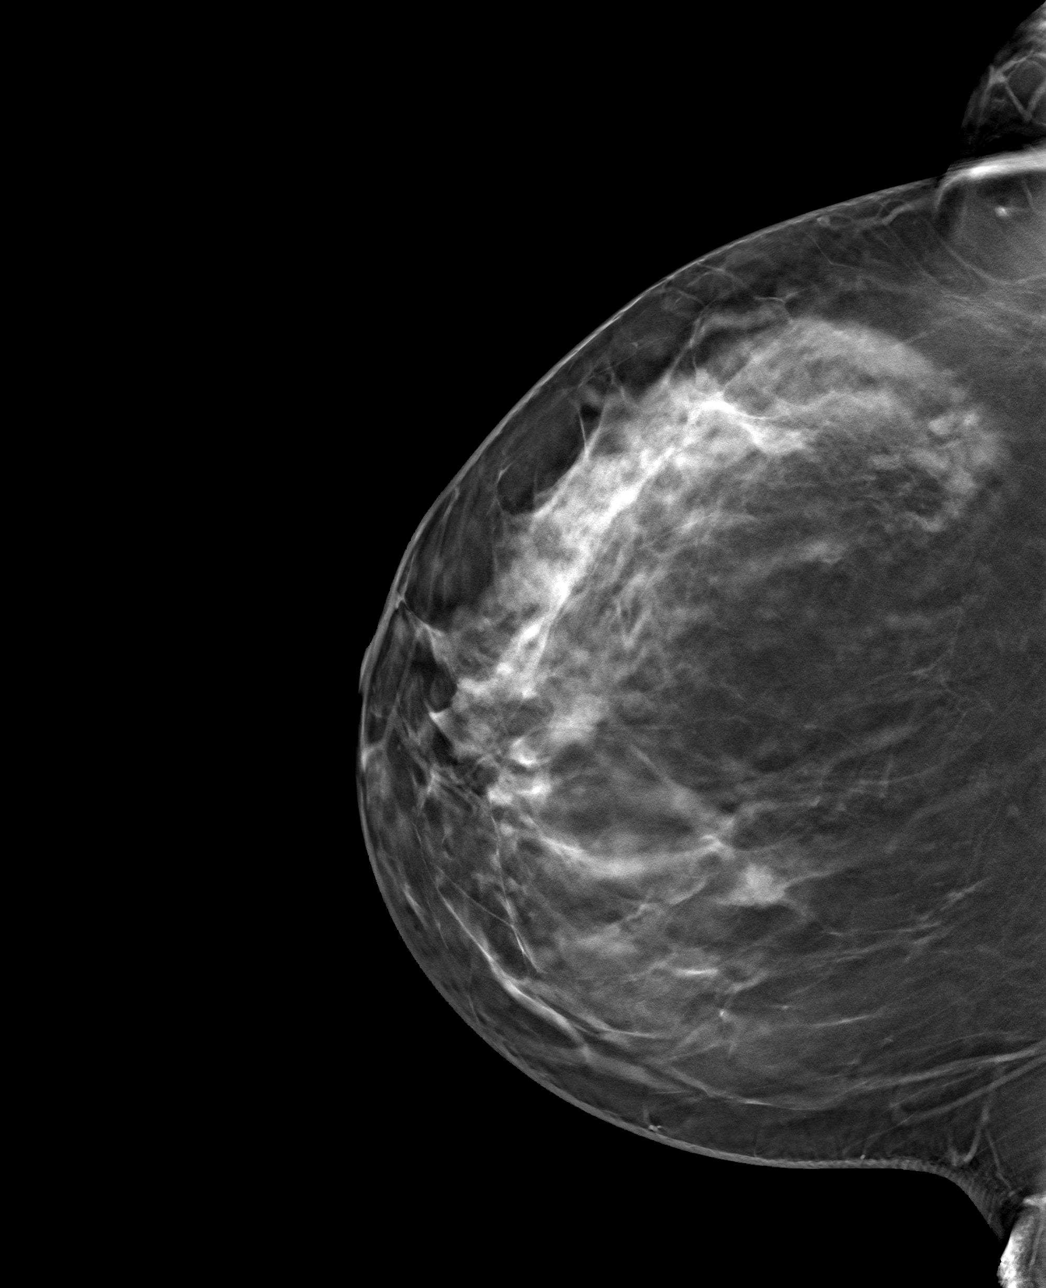

[4 of 12 positions shown; findings below may reference images not displayed]

FINDINGS: 2D and 3D full field CC and mediolateral images were obtained
following ultrasound guided biopsy of a mass in the outer RIGHT
breast at the 9 o'clock location which partially collapsed during
the biopsy. The coil shaped tissue marking clip is appropriately
positioned at the site of the biopsied mass which is much less
conspicuous than on the screening mammogram; the biopsied mass does
correspond to the mass seen on the screening mammogram.

Expected post biopsy changes are present without evidence of
hematoma.
IMPRESSION: Appropriate positioning of the coil shaped biopsy marking clip at
the site of the biopsied mass in the outer RIGHT breast at the 9
o'clock location. The mass is less conspicuous than on the screening
mammogram as it partially collapsed with the biopsy.

Final Assessment: Post Procedure Mammograms for Marker Placement

## 2022-08-13 IMAGING — MG US  BREAST BX W/ LOC DEV 1ST LESION IMG BX SPEC US GUIDE*R*
1 series · 8 of 8 positions shown · non-contrast
Comparison: Previous exams.
COMPARISON: Previous exams.

Addendum:
CLINICAL DATA: 51-year-old with a screening detected indeterminate
0.5 cm mass involving the UPPER OUTER QUADRANT of the RIGHT breast
at 9 o'clock 8 cm from the nipple.

EXAM:
ULTRASOUND GUIDED RIGHT BREAST CORE NEEDLE BIOPSY

[Series 1: MG view · 0.06mm/px · 8 of 18 slices shown]
[im 1/18]
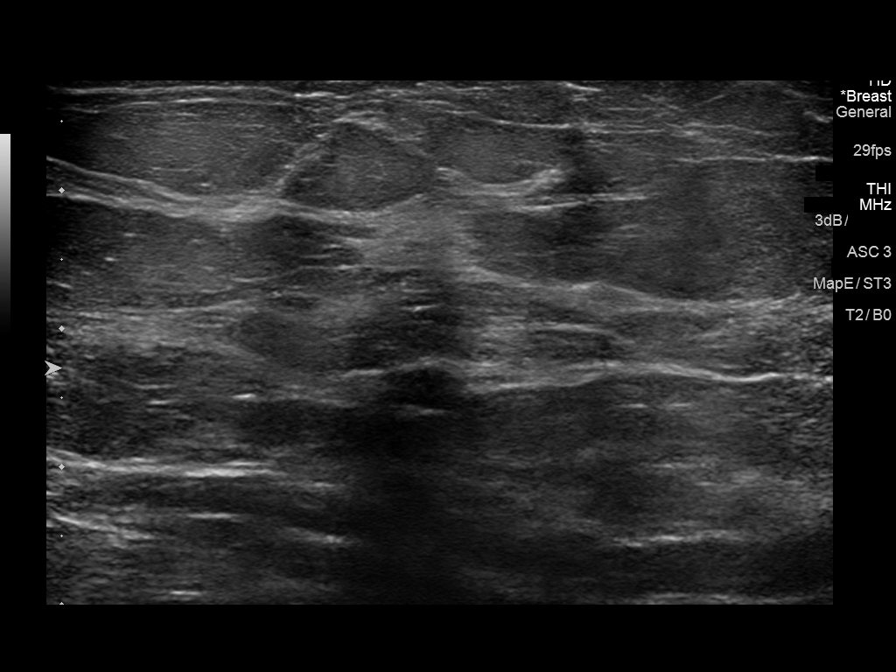
[im 3/18]
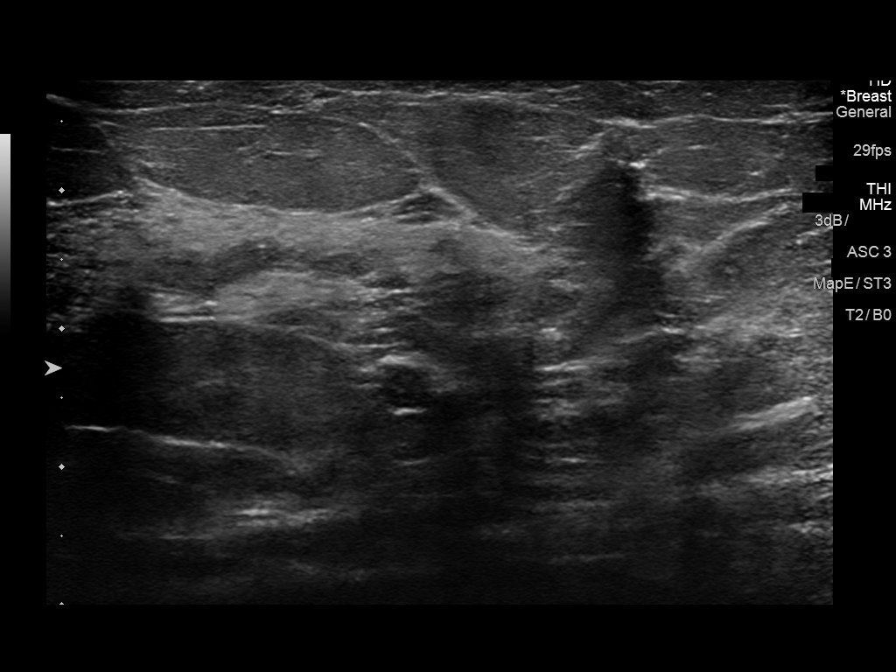
[im 5/18]
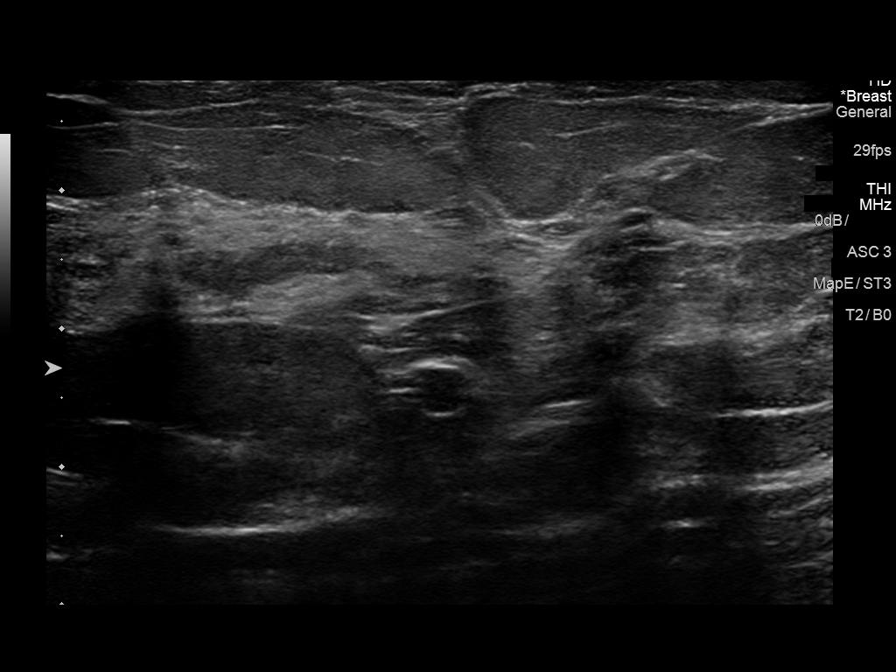
[im 8/18]
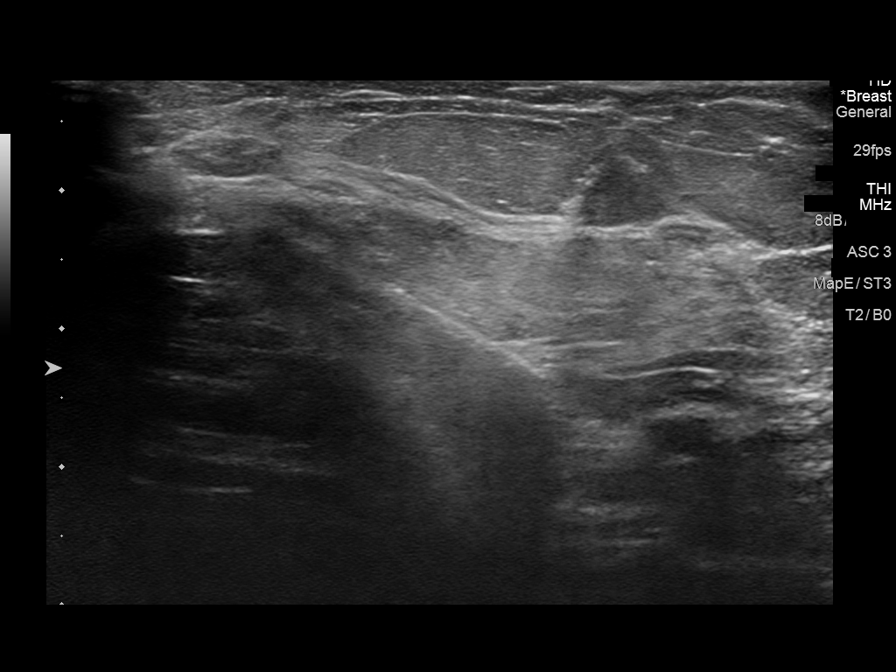
[im 10/18]
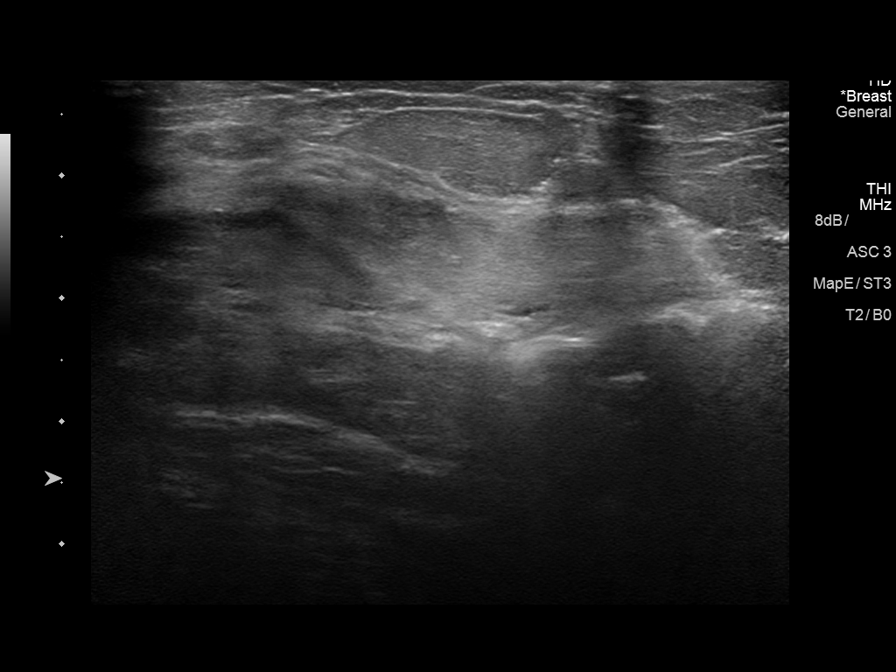
[im 13/18]
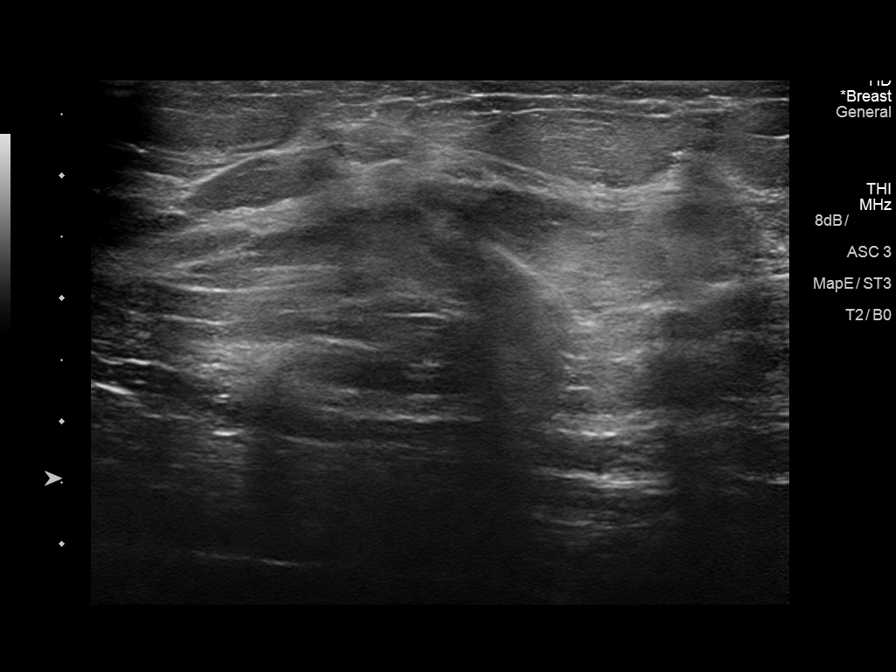
[im 15/18]
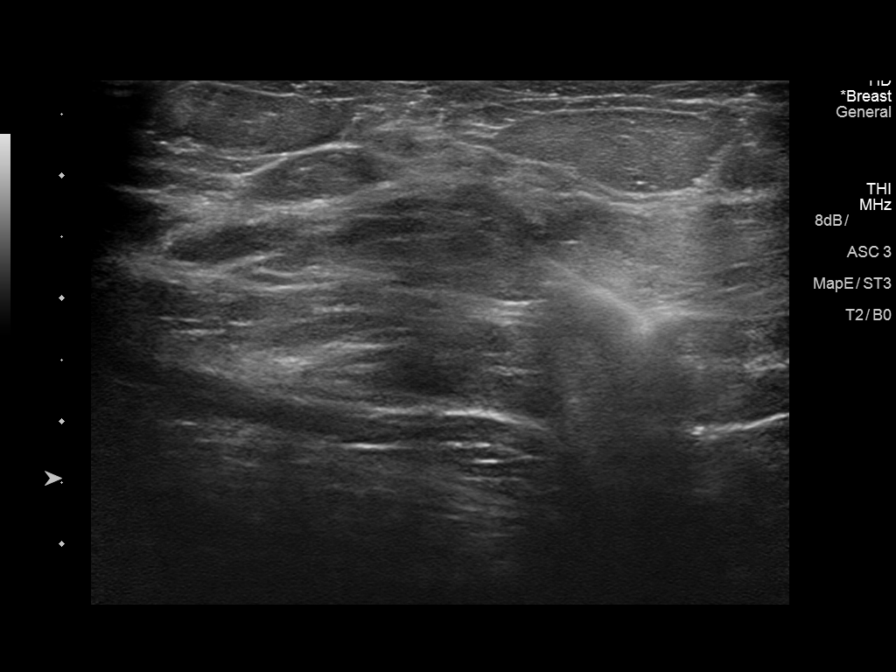
[im 18/18]
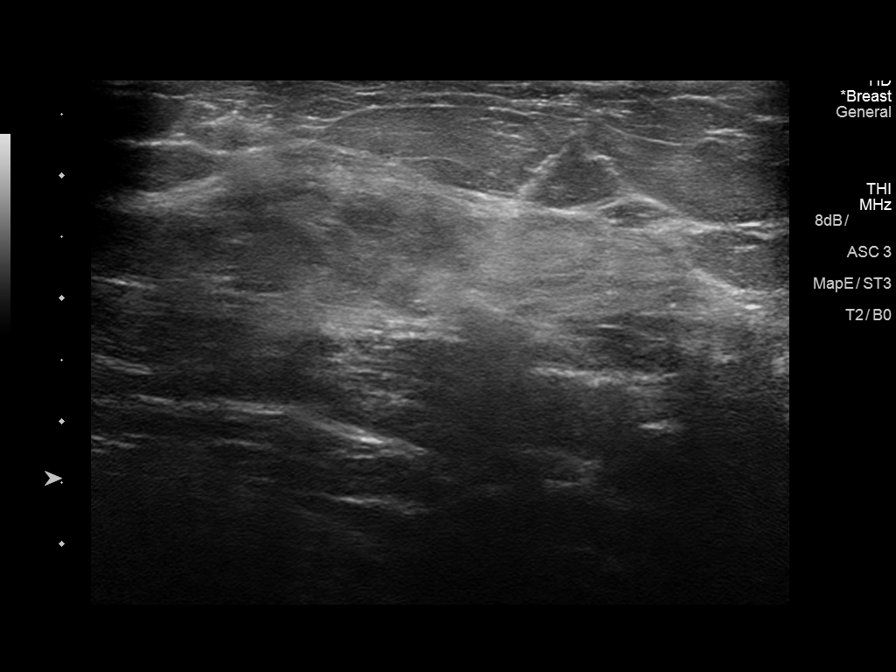

[8 of 8 positions shown; findings below may reference images not displayed]



Lesion quadrant: Outer breast, near 9 o'clock location.

Using sterile technique with chlorhexidine as skin antisepsis, 1%
lidocaine and 1% lidocaine with epinephrine as local anesthetic,
under direct ultrasound visualization, a 12 gauge Witkacy Elsner core
needle device placed through an 11 gauge introducer needle was used
to perform biopsy of the mass in the outer breast using a lateral
approach. The mass partially collapsed during the biopsy procedure.
At the conclusion of the procedure, a coil shaped tissue marker clip
was deployed into the biopsy cavity. Follow up 2 view mammogram was
performed and dictated separately.
IMPRESSION: Ultrasound guided biopsy of a mass in the outer RIGHT breast which
partially collapsed during the biopsy procedure. No apparent
complications.

ADDENDUM:
Pathology revealed BREAST, RIGHT AT [DATE], 4 CM FROM THE NIPPLE;
ULTRASOUND-GUIDED CORE NEEDLE BIOPSY: BENIGN MAMMARY PARENCHYMA WITH
FIBROCYSTIC AND APOCRINE CHANGES- NEGATIVE FOR ATYPICAL
PROLIFERATIVE BREAST DISEASE (coil clip). This was found to be
concordant by Dr. Blondinacka Samsonaite.

Pathology results were discussed with the patient by telephone. The
patient reported doing well after the biopsy with tenderness at the
site. Post biopsy instructions and care were reviewed and questions
were answered. The patient was encouraged to call [facility]HOSPITAL[facility] for any additional
concerns.

The patient was instructed to return for annual screening
mammography and was informed a reminder notice would be sent
regarding this appointment.

Pathology results reported by Og Jg RN on 05/26/2022.



Lesion quadrant: Outer breast, near 9 o'clock location.

Using sterile technique with chlorhexidine as skin antisepsis, 1%
lidocaine and 1% lidocaine with epinephrine as local anesthetic,
under direct ultrasound visualization, a 12 gauge Witkacy Elsner core
needle device placed through an 11 gauge introducer needle was used
to perform biopsy of the mass in the outer breast using a lateral
approach. The mass partially collapsed during the biopsy procedure.
At the conclusion of the procedure, a coil shaped tissue marker clip
was deployed into the biopsy cavity. Follow up 2 view mammogram was
performed and dictated separately.
IMPRESSION: Ultrasound guided biopsy of a mass in the outer RIGHT breast which
partially collapsed during the biopsy procedure. No apparent
complications.

## 2023-03-31 ENCOUNTER — Other Ambulatory Visit: Payer: Self-pay | Admitting: Internal Medicine

## 2023-03-31 DIAGNOSIS — Z1231 Encounter for screening mammogram for malignant neoplasm of breast: Secondary | ICD-10-CM

## 2023-06-14 ENCOUNTER — Ambulatory Visit
Admission: RE | Admit: 2023-06-14 | Discharge: 2023-06-14 | Disposition: A | Discharge status: Home / Self Care | Payer: BC Managed Care – PPO | Source: Ambulatory Visit | Attending: Internal Medicine | Admitting: Internal Medicine

## 2023-06-14 DIAGNOSIS — Z1231 Encounter for screening mammogram for malignant neoplasm of breast: Secondary | ICD-10-CM | POA: Insufficient documentation

## 2024-02-08 ENCOUNTER — Other Ambulatory Visit: Payer: Self-pay

## 2024-02-08 ENCOUNTER — Encounter: Payer: Self-pay | Admitting: Emergency Medicine

## 2024-02-08 ENCOUNTER — Emergency Department
Admission: EM | Admit: 2024-02-08 | Discharge: 2024-02-08 | Disposition: A | Discharge status: Home / Self Care | Payer: BC Managed Care – PPO | Attending: Emergency Medicine | Admitting: Emergency Medicine

## 2024-02-08 ENCOUNTER — Emergency Department: Payer: BC Managed Care – PPO

## 2024-02-08 DIAGNOSIS — R519 Headache, unspecified: Secondary | ICD-10-CM | POA: Diagnosis present

## 2024-02-08 DIAGNOSIS — I10 Essential (primary) hypertension: Secondary | ICD-10-CM | POA: Diagnosis not present

## 2024-02-08 LAB — RESP PANEL BY RT-PCR (RSV, FLU A&B, COVID)  RVPGX2
Influenza A by PCR: NEGATIVE
Influenza B by PCR: NEGATIVE
Resp Syncytial Virus by PCR: NEGATIVE
SARS Coronavirus 2 by RT PCR: NEGATIVE

## 2024-02-08 MED ORDER — LIDOCAINE 5 % EX PTCH: 1.0000 | MEDICATED_PATCH | Freq: Two times a day (BID) | CUTANEOUS | 0 refills | Status: AC

## 2024-02-08 MED ORDER — BUTALBITAL-APAP-CAFFEINE 50-325-40 MG PO TABS
2.0000 | ORAL_TABLET | Freq: Once | ORAL | Status: AC
Start: 2024-02-08 — End: 2024-02-08
  Administered 2024-02-08: 2 via ORAL
  Filled 2024-02-08: qty 2

## 2024-02-08 MED ORDER — KETOROLAC TROMETHAMINE 30 MG/ML IJ SOLN
30.0000 mg | Freq: Once | INTRAMUSCULAR | Status: AC
Start: 2024-02-08 — End: 2024-02-08
  Administered 2024-02-08: 30 mg via INTRAMUSCULAR
  Filled 2024-02-08: qty 1

## 2024-02-08 MED ORDER — LIDOCAINE 5 % EX PTCH
1.0000 | MEDICATED_PATCH | CUTANEOUS | Status: DC
Start: 2024-02-08 — End: 2024-02-08
  Administered 2024-02-08: 1 via TRANSDERMAL
  Filled 2024-02-08: qty 1

## 2024-02-08 NOTE — ED Triage Notes (Addendum)
Patient ambulatory to triage with steady gait, without difficulty or distress noted; pt reports rt sided HA x 4 days accomp by nausea; denies hx of same, denies any recent illness; pt A&Ox3, PERRL, MAEW

## 2024-02-08 NOTE — Discharge Instructions (Addendum)
Please take Tylenol and ibuprofen/Advil for your pain.  It is safe to take them together, or to alternate them every few hours.  Take up to 1000mg of Tylenol at a time, up to 4 times per day.  Do not take more than 4000 mg of Tylenol in 24 hours.  For ibuprofen, take 400-600 mg, 3 - 4 times per day.  Please use lidocaine patches at your site of pain.  Apply 1 patch at a time, leave on for 12 hours, then remove for 12 hours.  12 hours on, 12 hours off.  Do not apply more than 1 patch at a time.  

## 2024-02-08 NOTE — ED Provider Notes (Signed)
[facility] Provider Note    Event Date/Time   First MD Initiated Contact with Patient 02/08/24 [phone]     (approximate)   History   Headache   HPI  Felicia Barker is a 53 y.o. female who presents to the ED for evaluation of Headache   Review of PCP visit from last month.  Seen for URI.  Obese patient with history of HTN  Patient presents to the ED with a bad headache on the right side of her head that has been waxing and waning over the past 3 to 4 days.  Worse at night.  Transiently improved with OTC medications.  Pressure behind the right, pain to her right shoulder and neck.  No trauma, falls or injuries.  No fevers, weakness to the extremities   Physical Exam   Triage Vital Signs: ED Triage Vitals  Encounter Vitals Group     BP 02/08/24 0629 (!) 170/139     Systolic BP Percentile --      Diastolic BP Percentile --      Pulse Rate 02/08/24 0629 68     Resp 02/08/24 0629 16     Temp 02/08/24 0629 97.9 F (36.6 C)     Temp Source 02/08/24 0629 Oral     SpO2 02/08/24 0629 95 %     Weight 02/08/24 0626 240 lb (108.9 kg)     Height 02/08/24 0626 5\' 6"  (1.676 m)     Head Circumference --      Peak Flow --      Pain Score 02/08/24 0626 8     Pain Loc --      Pain Education --      Exclude from Growth Chart --     Most recent vital signs: Vitals:   02/08/24 0629  BP: (!) 170/139  Pulse: 68  Resp: 16  Temp: 97.9 F (36.6 C)  SpO2: 95%    General: Awake, no distress.  Seems mildly uncomfortable but looks systemically well.  No meningeal features.  Some tenderness to the right trapezius musculature.  No overlying skin changes CV:  Good peripheral perfusion.  Resp:  Normal effort.  Abd:  No distention.  MSK:  No deformity noted.  Neuro:  No focal deficits appreciated. Other:     ED Results / Procedures / Treatments   Labs (all labs ordered are listed, but only abnormal results are displayed) Labs Reviewed  RESP PANEL BY RT-PCR (RSV,  FLU A&B, COVID)  [id number]    EKG   RADIOLOGY CT head interpreted by me without evidence of acute intracranial pathology  Official radiology report(s): CT Head Wo Contrast Result Date: 02/08/2024 CLINICAL DATA:  Headache EXAM: CT HEAD WITHOUT CONTRAST TECHNIQUE: Contiguous axial images were obtained from the base of the skull through the vertex without intravenous contrast. RADIATION DOSE REDUCTION: This exam was performed according to the departmental dose-optimization program which includes automated exposure control, adjustment of the mA and/or kV according to patient size and/or use of iterative reconstruction technique. COMPARISON:  None Available. FINDINGS: Brain: No evidence of acute infarction, hemorrhage, hydrocephalus, extra-axial collection or mass lesion/mass effect. Vascular: No hyperdense vessel or unexpected calcification. Skull: Normal. Negative for fracture or focal lesion. Sinuses/Orbits: No acute finding. IMPRESSION: Negative head CT. Electronically Signed   By: Tiburcio Pea M.D.   On: 02/08/2024 07:34    PROCEDURES and INTERVENTIONS:  Procedures  Medications  lidocaine (LIDODERM) 5 % 1 patch (1 patch Transdermal Patch Applied 02/08/24  9562)  ketorolac (TORADOL) 30 MG/ML injection 30 mg (30 mg Intramuscular Given 02/08/24 0855)  butalbital-acetaminophen-caffeine (FIORICET) 50-325-40 MG per tablet 2 tablet (2 tablets Oral Given 02/08/24 0855)     IMPRESSION / MDM / ASSESSMENT AND PLAN / ED COURSE  I reviewed the triage vital signs and the nursing notes.  Differential diagnosis includes, but is not limited to, tension headache, migraine headache, ICH, meningitis, muscular spasm  {Patient presents with symptoms of an acute illness or injury that is potentially life-threatening.  Patient presents with a benign headache without concerning features and suitable for outpatient management.  CT head obtained, as above.  Negative viral swabs.  Suspect migraine versus tension  headache.  Clinical Course as of 02/08/24 0949  Thu Feb 08, 2024  0947 Reassessed.  Resolution of pain.  She is appreciative.  We discussed management of headaches at home and ED return precautions.  Answered questions. [DS]    Clinical Course User Index [DS] Delton Prairie, MD     FINAL CLINICAL IMPRESSION(S) / ED DIAGNOSES   Final diagnoses:  Bad headache     Rx / DC Orders   ED Discharge Orders          Ordered    lidocaine (LIDODERM) 5 %  Every 12 hours        02/08/24 0947             Note:  This document was prepared using Dragon voice recognition software and may include unintentional dictation errors.   Delton Prairie, MD 02/08/24 [phone]
